# Patient Record
Sex: Male | Born: 1990
Health system: Southern US, Community
[De-identification: ages and names within clinical notes are randomized; demographics above are authoritative.]

## PROBLEM LIST (undated history)

## (undated) DIAGNOSIS — F32A Depression, unspecified: Secondary | ICD-10-CM

## (undated) DIAGNOSIS — F419 Anxiety disorder, unspecified: Secondary | ICD-10-CM

## (undated) DIAGNOSIS — F909 Attention-deficit hyperactivity disorder, unspecified type: Secondary | ICD-10-CM

## (undated) HISTORY — PX: SKIN BIOPSY: SHX1

## (undated) HISTORY — DX: Attention-deficit hyperactivity disorder, unspecified type: F90.9

---

## 2017-12-28 DIAGNOSIS — M9903 Segmental and somatic dysfunction of lumbar region: Secondary | ICD-10-CM | POA: Diagnosis not present

## 2017-12-28 DIAGNOSIS — M9904 Segmental and somatic dysfunction of sacral region: Secondary | ICD-10-CM | POA: Diagnosis not present

## 2017-12-28 DIAGNOSIS — M5386 Other specified dorsopathies, lumbar region: Secondary | ICD-10-CM | POA: Diagnosis not present

## 2018-01-02 DIAGNOSIS — M9903 Segmental and somatic dysfunction of lumbar region: Secondary | ICD-10-CM | POA: Diagnosis not present

## 2018-01-02 DIAGNOSIS — M5386 Other specified dorsopathies, lumbar region: Secondary | ICD-10-CM | POA: Diagnosis not present

## 2018-01-02 DIAGNOSIS — M9904 Segmental and somatic dysfunction of sacral region: Secondary | ICD-10-CM | POA: Diagnosis not present

## 2018-01-29 DIAGNOSIS — H16223 Keratoconjunctivitis sicca, not specified as Sjogren's, bilateral: Secondary | ICD-10-CM | POA: Diagnosis not present

## 2018-02-18 ENCOUNTER — Emergency Department (HOSPITAL_COMMUNITY)
Admission: EM | Admit: 2018-02-18 | Discharge: 2018-02-18 | Disposition: A | Discharge status: Home / Self Care | Payer: Commercial Managed Care - PPO | Attending: Emergency Medicine | Admitting: Emergency Medicine

## 2018-02-18 ENCOUNTER — Emergency Department (HOSPITAL_COMMUNITY): Payer: Commercial Managed Care - PPO

## 2018-02-18 ENCOUNTER — Emergency Department (HOSPITAL_BASED_OUTPATIENT_CLINIC_OR_DEPARTMENT_OTHER): Payer: Commercial Managed Care - PPO

## 2018-02-18 ENCOUNTER — Encounter (HOSPITAL_COMMUNITY): Payer: Self-pay | Admitting: Emergency Medicine

## 2018-02-18 DIAGNOSIS — R0789 Other chest pain: Secondary | ICD-10-CM | POA: Insufficient documentation

## 2018-02-18 DIAGNOSIS — M79609 Pain in unspecified limb: Secondary | ICD-10-CM

## 2018-02-18 DIAGNOSIS — M79605 Pain in left leg: Secondary | ICD-10-CM | POA: Insufficient documentation

## 2018-02-18 DIAGNOSIS — R079 Chest pain, unspecified: Secondary | ICD-10-CM | POA: Diagnosis not present

## 2018-02-18 LAB — BASIC METABOLIC PANEL
Anion gap: 12 (ref 5–15)
BUN: 15 mg/dL (ref 6–20)
CO2: 24 mmol/L (ref 22–32)
CREATININE: 1.15 mg/dL (ref 0.61–1.24)
Calcium: 9.4 mg/dL (ref 8.9–10.3)
Chloride: 101 mmol/L (ref 98–111)
GFR calc Af Amer: >60 mL/min (ref >60)
Glucose, Bld: 82 mg/dL (ref 70–99)
Potassium: 4.1 mmol/L (ref 3.5–5.1)
SODIUM: 137 mmol/L (ref 135–145)

## 2018-02-18 LAB — I-STAT TROPONIN, ED
Troponin i, poc: 0 ng/mL (ref 0.00–0.08)
Troponin i, poc: 0 ng/mL (ref 0.00–0.08)

## 2018-02-18 LAB — CBC
HEMATOCRIT: 43.9 % (ref 39.0–52.0)
HEMOGLOBIN: 14.9 g/dL (ref 13.0–17.0)
MCH: 29.9 pg (ref 26.0–34.0)
MCHC: 33.9 g/dL (ref 30.0–36.0)
MCV: 88.2 fL (ref 78.0–100.0)
Platelets: 257 10*3/uL (ref 150–400)
RBC: 4.98 MIL/uL (ref 4.22–5.81)
RDW: 12 % (ref 11.5–15.5)
WBC: 5.8 10*3/uL (ref 4.0–10.5)

## 2018-02-18 MED ORDER — IBUPROFEN 600 MG PO TABS: 600.0000 mg | ORAL_TABLET | Freq: Four times a day (QID) | ORAL | 0 refills | Status: DC | PRN

## 2018-02-18 MED ORDER — IBUPROFEN 200 MG PO TABS
600.0000 mg | ORAL_TABLET | Freq: Once | ORAL | Status: AC
  Administered 2018-02-18: 600 mg via ORAL
  Filled 2018-02-18: qty 1

## 2018-02-18 NOTE — Discharge Instructions (Addendum)
Take ibuprofen every 6 hours as needed for your pain.  Avoid foods and drinks that make your chest pain worse.  You can try over-the-counter Pepcid as needed for this.  Please follow-up with your primary care provider and/or Dr. Eulah PontMurphy, the orthopedic doctor, for further evaluation and treatment of your leg pain.  Use ice on your leg 3-4 times daily alternating 20 minutes on, 20 minutes off.  Return to emergency department immediately if you develop any new or worsening symptoms.

## 2018-02-18 NOTE — Progress Notes (Signed)
VASCULAR LAB PRELIMINARY  PRELIMINARY  PRELIMINARY  PRELIMINARY  Left lower extremity venous duplex completed.    Preliminary report:  There is no DVT or SVT noted in the left lower extremity.   Called results to Elizebeth BrookingEmily  Luticia Tadros, RVT 02/18/2018, 3:14 PM

## 2018-02-18 NOTE — ED Provider Notes (Signed)
[facility] EMERGENCY DEPARTMENT Provider Note   CSN: [id number] Arrival date & time: 02/18/18  1146     History   Chief Complaint Chief Complaint  Patient presents with  . Leg Pain    HPI Glenn Hoffman is a 27 y.o. male who is previously healthy who presents with a 3 to 4 week history of left leg pain.  He reports he woke up one day and felt like a tight muscle, however it continues to hurt him.  Is worse with walking.  He has not noted any significant swelling.  He also reports prior to that having a dull ache in the left side of his chest.  It is intermittent.  It is sometimes worse after big meal.  He states it is worse on exertion he reports sometimes it might be pleuritic.  He reports today he was sitting at a computer in both his legs were going.  After this, he reports he became short of breath and his fingers became numb and tingly.  He denies any nausea, vomiting, abdominal pain, back pain, saddle anesthesia, loss of bowel or bladder control.  He also denies any recent long trips, surgeries, personal history of blood clot.  HPI  History reviewed. No pertinent past medical history.  There are no active problems to display for this patient.   History reviewed. No pertinent surgical history.      Home Medications    Prior to Admission medications   Medication Sig Start Date End Date Taking? Authorizing Provider  ibuprofen (ADVIL,MOTRIN) 600 MG tablet Take 1 tablet (600 mg total) by mouth every 6 (six) hours as needed. 02/18/18   Emi Holes, PA-C    Family History No family history on file.  Social History Social History   Tobacco Use  . Smoking status: Not on file  Substance Use Topics  . Alcohol use: Not on file  . Drug use: Not on file     Allergies   Patient has no known allergies.   Review of Systems Review of Systems  Constitutional: Negative for chills and fever.  HENT: Negative for facial swelling and sore throat.     Respiratory: Positive for shortness of breath.   Cardiovascular: Positive for chest pain.  Gastrointestinal: Negative for abdominal pain, nausea and vomiting.  Genitourinary: Negative for dysuria.  Musculoskeletal: Positive for myalgias. Negative for back pain.  Skin: Negative for rash and wound.  Neurological: Positive for numbness. Negative for headaches.  Psychiatric/Behavioral: The patient is not nervous/anxious.      Physical Exam Updated Vital Signs BP 132/87 (BP Location: Right Arm)   Pulse 63   Temp 97.9 F (36.6 C) (Oral)   Resp 16   SpO2 100%   Physical Exam  Constitutional: He appears well-developed and well-nourished. No distress.  HENT:  Head: Normocephalic and atraumatic.  Mouth/Throat: Oropharynx is clear and moist. No oropharyngeal exudate.  Eyes: Pupils are equal, round, and reactive to light. Conjunctivae are normal. Right eye exhibits no discharge. Left eye exhibits no discharge. No scleral icterus.  Neck: Normal range of motion. Neck supple. No thyromegaly present.  Cardiovascular: Normal rate, regular rhythm, normal heart sounds and intact distal pulses. Exam reveals no gallop and no friction rub.  No murmur heard. Pulmonary/Chest: Effort normal and breath sounds normal. No stridor. No respiratory distress. He has no wheezes. He has no rales.  Abdominal: Soft. Bowel sounds are normal. He exhibits no distension. There is no tenderness. There is no rebound and  no guarding.  Musculoskeletal: He exhibits no edema.       Legs: No significant edema to bilateral lower extremities, but some left calf tenderness  Lymphadenopathy:    He has no cervical adenopathy.  Neurological: He is alert. Coordination normal.  Skin: Skin is warm and dry. No rash noted. He is not diaphoretic. No pallor.  Psychiatric: He has a normal mood and affect.  Nursing note and vitals reviewed.    ED Treatments / Results  Labs (all labs ordered are listed, but only abnormal results  are displayed) Labs Reviewed  BASIC METABOLIC PANEL  CBC  I-STAT TROPONIN, ED  I-STAT TROPONIN, ED    EKG None  Radiology Dg Chest 2 View  Result Date: 02/18/2018 CLINICAL DATA:  Intermittent left chest pain for the past month. EXAM: CHEST - 2 VIEW COMPARISON:  None. FINDINGS: The heart size and mediastinal contours are within normal limits. Both lungs are clear. The visualized skeletal structures are unremarkable. IMPRESSION: Normal chest. Electronically Signed   By: Obie DredgeWilliam T Derry M.D.   On: 02/18/2018 12:28    Procedures Procedures (including critical care time)  Medications Ordered in ED Medications  ibuprofen (ADVIL,MOTRIN) tablet 600 mg (600 mg Oral Given 02/18/18 1605)     Initial Impression / Assessment and Plan / ED Course  I have reviewed the triage vital signs and the nursing notes.  Pertinent labs & imaging results that were available during my care of the patient were reviewed by me and considered in my medical decision making (see chart for details).     Patient with a 3 to 4-week history of left calf pain as well as intermittent left-sided chest pain that is worse with certain foods and drinking alcohol.  CBC, BMP and also troponin are negative.  Chest x-ray is negative.  EKG shows NSR with some rightward axis.  Patient is PERC negative.  DVT ultrasound is negative.  Low suspicion for PE at this time.  Very low suspicion for ACS.  Suspect muscular cause of calf pain.  Patient may also have an anxiety component considering he has had some tingling in his hands and shortness of breath when he thinks about a possible medical problem.  Will refer to PCP for further evaluation and treatment.  I advised anti-inflammatory medication and Pepcid as GERD may be probable cause of his intermittent chest pain with large meals and alcohol.  Patient understands and agrees with plan.  Return precautions discussed.  Patient vitals stable throughout ED course and discharged in  satisfactory condition. I discussed patient case with Dr. Rhunette CroftNanavati who guided the patient's management and agrees with plan.   Final Clinical Impressions(s) / ED Diagnoses   Final diagnoses:  Left leg pain  Atypical chest pain    ED Discharge Orders        Ordered    ibuprofen (ADVIL,MOTRIN) 600 MG tablet  Every 6 hours PRN     02/18/18 1640       Emi HolesLaw, Summit Borchardt M, PA-C 02/18/18 2009    Derwood KaplanNanavati, Ankit, MD 02/19/18 [phone]

## 2018-02-18 NOTE — ED Triage Notes (Addendum)
Patient complains of left calf pain x1 month, also reports when sitting down his legs quickly feel numb. Patient reports intermittent left chest pain Patient alert, oriented, and ambulating independently with steady gait.

## 2018-02-21 DIAGNOSIS — R202 Paresthesia of skin: Secondary | ICD-10-CM | POA: Diagnosis not present

## 2018-02-21 DIAGNOSIS — R079 Chest pain, unspecified: Secondary | ICD-10-CM | POA: Diagnosis not present

## 2018-02-21 DIAGNOSIS — M79606 Pain in leg, unspecified: Secondary | ICD-10-CM | POA: Diagnosis not present

## 2018-02-21 DIAGNOSIS — M2550 Pain in unspecified joint: Secondary | ICD-10-CM | POA: Diagnosis not present

## 2018-02-25 ENCOUNTER — Ambulatory Visit
Admission: RE | Admit: 2018-02-25 | Discharge: 2018-02-25 | Disposition: A | Discharge status: Home / Self Care | Payer: Commercial Managed Care - PPO | Source: Ambulatory Visit | Attending: Family Medicine | Admitting: Family Medicine

## 2018-02-25 ENCOUNTER — Other Ambulatory Visit: Payer: Self-pay | Admitting: Family Medicine

## 2018-02-25 DIAGNOSIS — R079 Chest pain, unspecified: Secondary | ICD-10-CM

## 2018-02-25 MED ORDER — IOPAMIDOL (ISOVUE-370) INJECTION 76%
75.0000 mL | Freq: Once | INTRAVENOUS | Status: AC | PRN
  Administered 2018-02-25: 75 mL via INTRAVENOUS

## 2018-04-30 ENCOUNTER — Ambulatory Visit: Payer: Commercial Managed Care - PPO | Admitting: Neurology

## 2018-05-15 DIAGNOSIS — R350 Frequency of micturition: Secondary | ICD-10-CM | POA: Diagnosis not present

## 2018-05-21 DIAGNOSIS — I861 Scrotal varices: Secondary | ICD-10-CM | POA: Diagnosis not present

## 2018-05-21 DIAGNOSIS — N433 Hydrocele, unspecified: Secondary | ICD-10-CM | POA: Diagnosis not present

## 2020-02-19 IMAGING — CR DG CHEST 2V
2 series · 2 of 2 positions shown · non-contrast
Comparison: None.

CLINICAL DATA: Intermittent left chest pain for the past month.

EXAM:
CHEST - 2 VIEW

[chest pa]
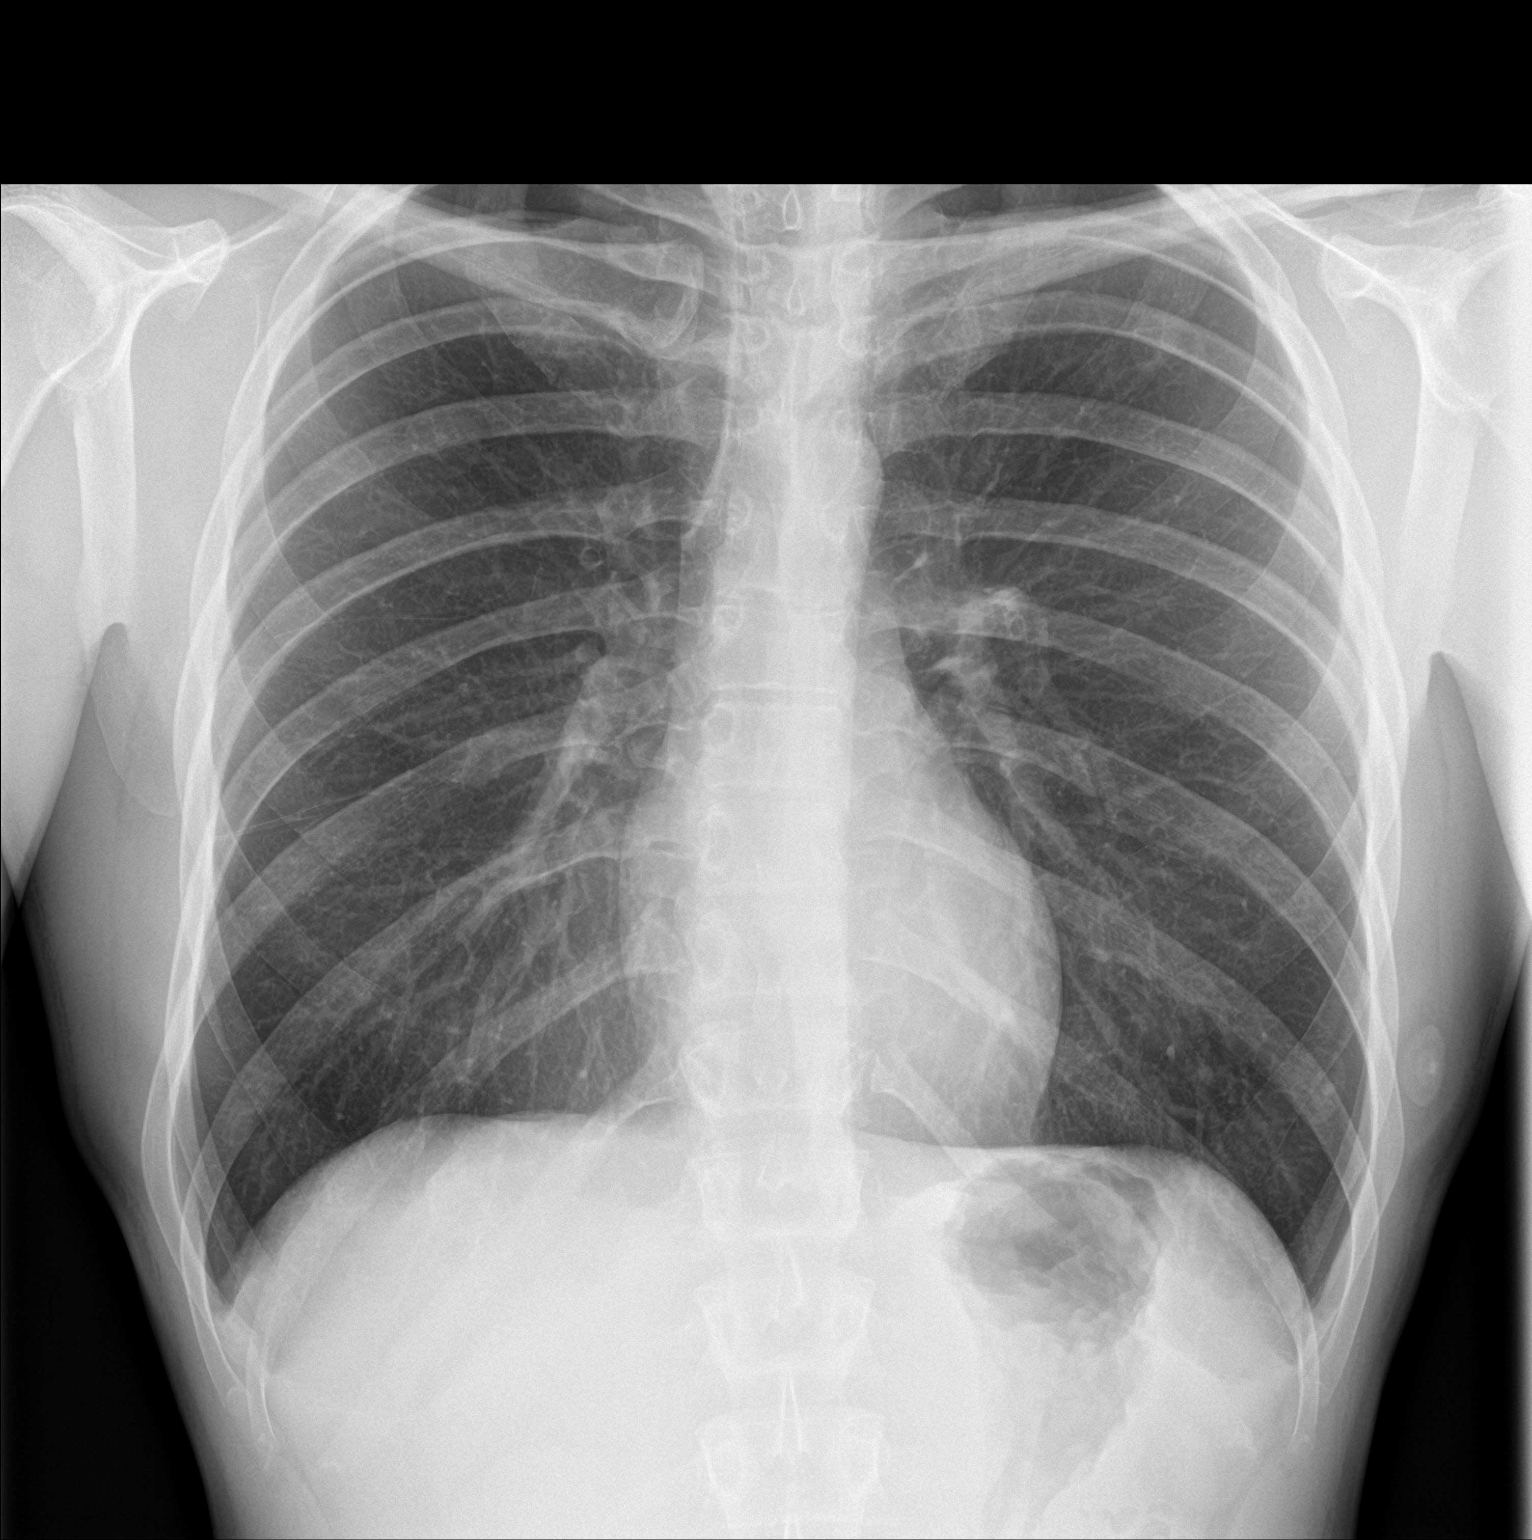

[chest lat]
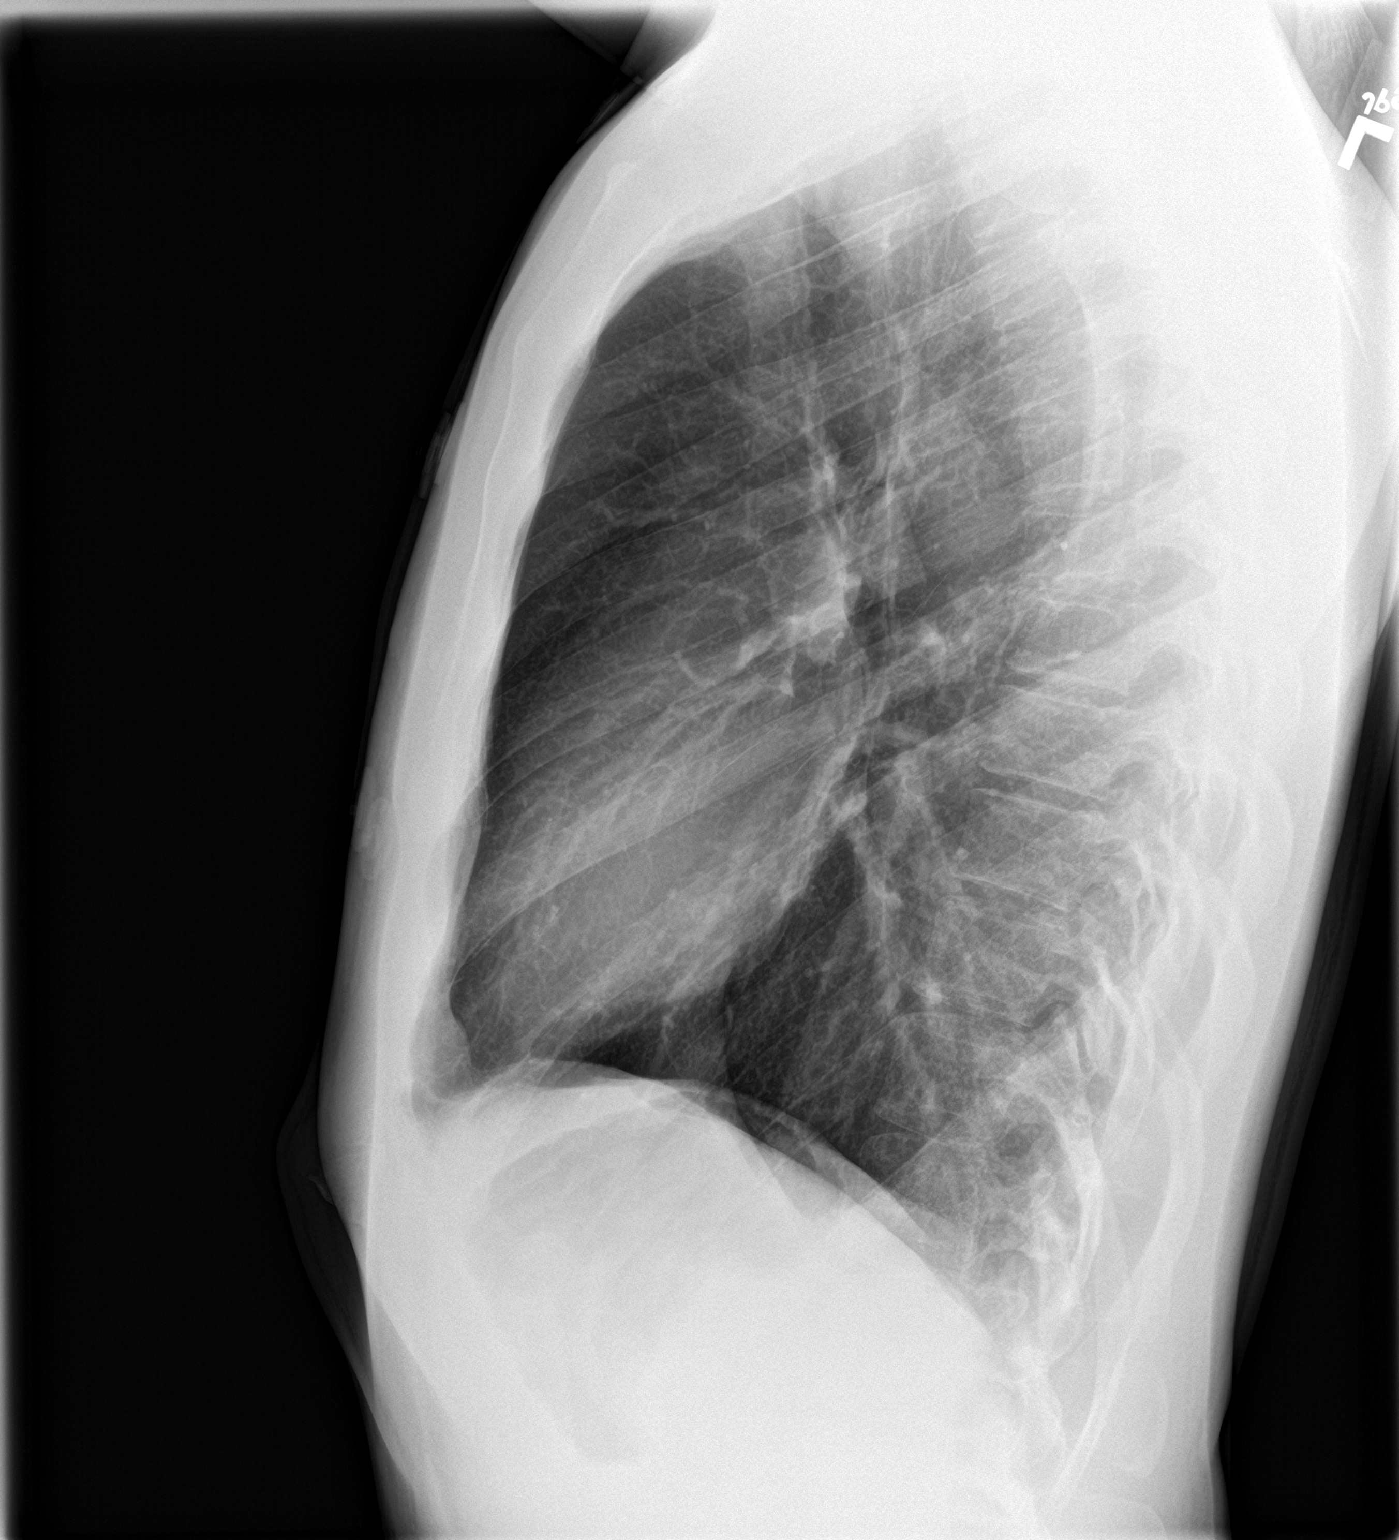

[2 of 2 positions shown; findings below may reference images not displayed]

FINDINGS: The heart size and mediastinal contours are within normal limits.
Both lungs are clear. The visualized skeletal structures are
unremarkable.
IMPRESSION: Normal chest.

## 2023-01-09 NOTE — Progress Notes (Unsigned)
NEUROLOGY CONSULTATION NOTE  Glenn Hoffman MRN: [id number] DOB: 05/09/1991  Referring provider: Lindaann Pascal, PA-C (Urgent Care referral) Primary care provider: No PCP  Reason for consult:  dizziness, headache  Assessment/Plan:   New daily persistent headache Positional dizziness  Positional blurred vision/diplopia Unclear etiology  Start with MRI/MRA of head to rule out secondary intracranial etiology He defers potential medication for now, at least until potential secondary causes are ruled out. Follow up after testing.   Subjective:  Glenn Hoffman is a 32 year old male who presents for headache and dizziness.  History supplemented by referring provider's note.  Symptoms started in March.  No preceding illness, head trauma, change in environment or new/worsening anxiety.  He has had a persistent headache.  Wakes up with dull headache (2/10) and gradually increases later in the day (no higher than 6/10).  He describes a pressure behind both eyes or across the forehead but it may lateralize to either side.  Exacerbated in severity and becomes throbbing with quick head movements or change in position, such as turning head, bending over or straightening up from a flexed position.  During this change in position, he may have associated blurred vision, sometimes actual horizontal or vertical diplopia, or dizziness (not spinning but rather an undulating sensation in his head).  Lasts just a few seconds.  When he moves his eyes, he can feel a tugging sensation behind the eye with pressure in the adjacent temple.  He also endorses aural fullness and has not sometimes pop his ears.  Occasional ringing in the ears.  Some photosenstivity which is not new.  No nausea, phonophobia, rhinorrhea, congestion, neck pain, jaw pain, numbness or weakness.  Treats with ibuprofen infrequently.  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  He went away on a trip to [place] in April and symptoms had resolved.  But they returned  once he came back home.  Head pressure is similar to symptoms he experienced when he had COVID back in early 2021.  No prior history of headaches.  About every 18 months or so, he has had episodes of dizziness and blurred vision but would last no longer than 2 weeks.       PAST MEDICAL HISTORY: Past Medical History:  Diagnosis Date   ADHD     PAST SURGICAL HISTORY: No past surgical history on file.  MEDICATIONS: Current Outpatient Medications on File Prior to Visit  Medication Sig Dispense Refill   ibuprofen (ADVIL,MOTRIN) 600 MG tablet Take 1 tablet (600 mg total) by mouth every 6 (six) hours as needed. 30 tablet 0   No current facility-administered medications on file prior to visit.    ALLERGIES: No Known Allergies  FAMILY HISTORY: No family history on file.  Objective:  Blood pressure 124/75, pulse 73, height 6\' 1"  (1.854 m), weight 173 lb 9.6 oz (78.7 kg), SpO2 97 %. General: No acute distress.  Patient appears well-groomed.   Head:  Normocephalic/atraumatic Eyes:  fundi examined but not visualized Neck: supple, no paraspinal tenderness, full range of motion Back: No paraspinal tenderness Heart: regular rate and rhythm Neurological Exam: Mental status: alert and oriented to person, place, and time, speech fluent and not dysarthric, language intact. Cranial nerves: CN I: not tested CN II: pupils equal, round and reactive to light, visual fields intact CN III, IV, VI:  full range of motion, no nystagmus, no ptosis CN V: facial sensation intact. CN VII: upper and lower face symmetric CN VIII: hearing intact CN IX, X:  gag intact, uvula midline CN XI: sternocleidomastoid and trapezius muscles intact CN XII: tongue midline Bulk & Tone: normal, no fasciculations. Motor:  muscle strength 5/5 throughout Sensation:  Pinprick, temperature and vibratory sensation intact. Deep Tendon Reflexes:  2+ throughout,  toes downgoing.   Finger to nose testing:  Without dysmetria.    Heel to shin:  Without dysmetria.   Gait:  Normal station and stride.  Romberg negative.    Thank you for allowing me to take part in the care of this patient.  Shon Millet, DO

## 2023-01-10 ENCOUNTER — Ambulatory Visit (INDEPENDENT_AMBULATORY_CARE_PROVIDER_SITE_OTHER): Payer: Managed Care, Other (non HMO) | Admitting: Neurology

## 2023-01-10 ENCOUNTER — Encounter: Payer: Self-pay | Admitting: Neurology

## 2023-01-10 VITALS — BP 124/75 | HR 73 | Ht 73.0 in | Wt 173.6 lb

## 2023-01-10 DIAGNOSIS — R42 Dizziness and giddiness: Secondary | ICD-10-CM | POA: Diagnosis not present

## 2023-01-10 DIAGNOSIS — H532 Diplopia: Secondary | ICD-10-CM | POA: Diagnosis not present

## 2023-01-10 DIAGNOSIS — G4452 New daily persistent headache (NDPH): Secondary | ICD-10-CM

## 2023-01-10 DIAGNOSIS — H814 Vertigo of central origin: Secondary | ICD-10-CM

## 2023-01-10 NOTE — Patient Instructions (Signed)
We will check MRI of brain with and without contrast and MRA of head Further recommendations pending results.

## 2023-02-26 ENCOUNTER — Other Ambulatory Visit: Payer: Managed Care, Other (non HMO)

## 2023-02-26 ENCOUNTER — Ambulatory Visit
Admission: RE | Admit: 2023-02-26 | Discharge: 2023-02-26 | Disposition: A | Discharge status: Home / Self Care | Payer: Managed Care, Other (non HMO) | Source: Ambulatory Visit | Attending: Neurology | Admitting: Neurology

## 2023-02-26 DIAGNOSIS — R42 Dizziness and giddiness: Secondary | ICD-10-CM

## 2023-02-26 DIAGNOSIS — H532 Diplopia: Secondary | ICD-10-CM

## 2023-02-26 DIAGNOSIS — G4452 New daily persistent headache (NDPH): Secondary | ICD-10-CM

## 2023-02-26 MED ORDER — SODIUM CHLORIDE 0.9 % IV BOLUS
500.0000 mL | Freq: Once | INTRAVENOUS | Status: AC
  Administered 2023-02-26: 500 mL via INTRAVENOUS

## 2023-02-26 NOTE — Progress Notes (Signed)
Pt was brought over to the nursing station after a vagal response from starting an IV in MRI. Pt was alert and oriented when she arrived, no complaints other than dizziness. The patients Vitals were obtained. 500 cc bolus was given and the patient reported he felt much better. However, the patients heart rate remained to stay low but the patient stated he wanted to leave to go home. Dr. Karin Golden was notified. Explained to the patient that if his HR remained low, he became dizzy again, or passed out, he should seek emergency medical attention. The patient verbalized understanding and insisted on leaving our facility.

## 2023-02-28 ENCOUNTER — Telehealth: Payer: Self-pay

## 2023-02-28 ENCOUNTER — Ambulatory Visit
Admission: RE | Admit: 2023-02-28 | Discharge: 2023-02-28 | Disposition: A | Discharge status: Home / Self Care | Payer: Managed Care, Other (non HMO) | Source: Ambulatory Visit | Attending: Neurology | Admitting: Neurology

## 2023-02-28 ENCOUNTER — Telehealth: Payer: Self-pay | Admitting: Neurology

## 2023-02-28 DIAGNOSIS — D329 Benign neoplasm of meninges, unspecified: Secondary | ICD-10-CM

## 2023-02-28 MED ORDER — GADOPICLENOL 0.5 MMOL/ML IV SOLN
7.5000 mL | Freq: Once | INTRAVENOUS | Status: AC | PRN
  Administered 2023-02-28: 7.5 mL via INTRAVENOUS

## 2023-02-28 NOTE — Telephone Encounter (Signed)
Telephone call from [facility] reading room Abnormal results,  IMPRESSION: 6.4 cm right parietal extra-axial mass with superior sagittal sinus invasion, most suggestive of a meningioma with solitary fibrous tumor/hemangiopericytoma being a differential consideration.

## 2023-02-28 NOTE — Telephone Encounter (Signed)
Pt is calling to see if he could get his results from the MRI that he had done on today.

## 2023-02-28 NOTE — Telephone Encounter (Signed)
Patient is calling for results from MRI

## 2023-03-01 NOTE — Telephone Encounter (Signed)
Patient called this morning wanting results. Patient would like to have them this morning as it is important/KB

## 2023-03-01 NOTE — Telephone Encounter (Signed)
I spoke with Glenn Hoffman regarding MRI results of the meningioma.  I would like to refer him to neurosurgery for further evaluation and potential treatment.   I answered all questions to the best of my ability    Referral sent to [facility]

## 2023-03-20 ENCOUNTER — Ambulatory Visit (HOSPITAL_COMMUNITY)
Admission: RE | Admit: 2023-03-20 | Discharge: 2023-03-20 | Disposition: A | Discharge status: Home / Self Care | Payer: Managed Care, Other (non HMO) | Source: Ambulatory Visit | Attending: Neurosurgery | Admitting: Neurosurgery

## 2023-03-20 ENCOUNTER — Ambulatory Visit (HOSPITAL_COMMUNITY): Payer: Managed Care, Other (non HMO)

## 2023-03-20 ENCOUNTER — Other Ambulatory Visit (HOSPITAL_COMMUNITY): Payer: Self-pay | Admitting: Neurosurgery

## 2023-03-20 DIAGNOSIS — D320 Benign neoplasm of cerebral meninges: Secondary | ICD-10-CM

## 2023-03-20 MED ORDER — GADOBUTROL 1 MMOL/ML IV SOLN
7.5000 mL | Freq: Once | INTRAVENOUS | Status: AC | PRN
  Administered 2023-03-20: 7.5 mL via INTRAVENOUS

## 2023-03-28 ENCOUNTER — Other Ambulatory Visit: Payer: Self-pay | Admitting: Neurosurgery

## 2023-03-28 ENCOUNTER — Other Ambulatory Visit (HOSPITAL_COMMUNITY): Payer: Self-pay | Admitting: Neurosurgery

## 2023-03-28 DIAGNOSIS — D320 Benign neoplasm of cerebral meninges: Secondary | ICD-10-CM

## 2023-04-03 ENCOUNTER — Other Ambulatory Visit: Payer: Self-pay | Admitting: Neurosurgery

## 2023-04-10 NOTE — Progress Notes (Signed)
Surgical Instructions   Your procedure is scheduled on Tuesday April 17, 2023. Report to [facility] Main Entrance "A" at 7:00 A.M., then check in with the Admitting office. Any questions or running late day of surgery: call [phone]  Questions prior to your surgery date: call [phone], Monday-Friday, 8am-4pm. If you experience any cold or flu symptoms such as cough, fever, chills, shortness of breath, etc. between now and your scheduled surgery, please notify us at the above number.     Remember:  Do not eat or drink after midnight the night before your surgery  Take these medicines the morning of surgery with A SIP OF WATER: NONE   One week prior to surgery, STOP taking any Aspirin (unless otherwise instructed by your surgeon) Aleve, Naproxen, Ibuprofen, Motrin, Advil, Goody's, BC's, all herbal medications, fish oil, and non-prescription vitamins.                     Do NOT Smoke (Tobacco/Vaping) for 24 hours prior to your procedure.  If you use a CPAP at night, you may bring your mask/headgear for your overnight stay.   You will be asked to remove any contacts, glasses, piercing's, hearing aid's, dentures/partials prior to surgery. Please bring cases for these items if needed.    Patients discharged the day of surgery will not be allowed to drive home, and someone needs to stay with them for 24 hours.  SURGICAL WAITING ROOM VISITATION Patients may have no more than 2 support people in the waiting area - these visitors may rotate.   Pre-op nurse will coordinate an appropriate time for 1 ADULT support person, who may not rotate, to accompany patient in pre-op.  Children under the age of 19 must have an adult with them who is not the patient and must remain in the main waiting area with an adult.  If the patient needs to stay at the hospital during part of their recovery, the visitor guidelines for inpatient rooms apply.  Please refer to the [facility] website for the  visitor guidelines for any additional information.   If you received a COVID test during your pre-op visit  it is requested that you wear a mask when out in public, stay away from anyone that may not be feeling well and notify your surgeon if you develop symptoms. If you have been in contact with anyone that has tested positive in the last 10 days please notify you surgeon.      Pre-operative CHG Bathing Instructions   You can play a key role in reducing the risk of infection after surgery. Your skin needs to be as free of germs as possible. You can reduce the number of germs on your skin by washing with CHG (chlorhexidine gluconate) soap before surgery. CHG is an antiseptic soap that kills germs and continues to kill germs even after washing.   DO NOT use if you have an allergy to chlorhexidine/CHG or antibacterial soaps. If your skin becomes reddened or irritated, stop using the CHG and notify one of our RNs at [phone].              TAKE A SHOWER THE NIGHT BEFORE SURGERY AND THE DAY OF SURGERY    Please keep in mind the following:  DO NOT shave, including legs and underarms, 48 hours prior to surgery.   You may shave your face before/day of surgery.  Place clean sheets on your bed the night before surgery Use a clean washcloth (  not used since being washed) for each shower. DO NOT sleep with pet's night before surgery.  CHG Shower Instructions:  If you choose to wash your hair and private area, wash first with your normal shampoo/soap.  After you use shampoo/soap, rinse your hair and body thoroughly to remove shampoo/soap residue.  Turn the water OFF and apply half the bottle of CHG soap to a CLEAN washcloth.  Apply CHG soap ONLY FROM YOUR NECK DOWN TO YOUR TOES (washing for 3-5 minutes)  DO NOT use CHG soap on face, private areas, open wounds, or sores.  Pay special attention to the area where your surgery is being performed.  If you are having back surgery, having someone wash  your back for you may be helpful. Wait 2 minutes after CHG soap is applied, then you may rinse off the CHG soap.  Pat dry with a clean towel  Put on clean pajamas    Additional instructions for the day of surgery: DO NOT APPLY any lotions, deodorants or cologne.   Do not wear jewelry Do not bring valuables to the hospital. [facility] is not responsible for valuables/personal belongings. Put on clean/comfortable clothes.  Please brush your teeth.  Ask your nurse before applying any prescription medications to the skin.

## 2023-04-11 ENCOUNTER — Other Ambulatory Visit: Payer: Self-pay

## 2023-04-11 ENCOUNTER — Encounter (HOSPITAL_COMMUNITY): Payer: Self-pay

## 2023-04-11 ENCOUNTER — Encounter (HOSPITAL_COMMUNITY)
Admission: RE | Admit: 2023-04-11 | Discharge: 2023-04-11 | Disposition: A | Discharge status: Home / Self Care | Payer: Managed Care, Other (non HMO) | Source: Ambulatory Visit | Attending: Neurosurgery | Admitting: Neurosurgery

## 2023-04-11 VITALS — BP 129/90 | HR 88 | Temp 98.8°F | Resp 18 | Ht 73.0 in | Wt 167.2 lb

## 2023-04-11 DIAGNOSIS — Z01818 Encounter for other preprocedural examination: Secondary | ICD-10-CM

## 2023-04-11 DIAGNOSIS — Z01812 Encounter for preprocedural laboratory examination: Secondary | ICD-10-CM | POA: Diagnosis not present

## 2023-04-11 HISTORY — DX: Depression, unspecified: F32.A

## 2023-04-11 HISTORY — DX: Anxiety disorder, unspecified: F41.9

## 2023-04-11 LAB — COMPREHENSIVE METABOLIC PANEL
ALT: 26 U/L (ref 0–44)
AST: 20 U/L (ref 15–41)
Albumin: 4.5 g/dL (ref 3.5–5.0)
Alkaline Phosphatase: 38 U/L (ref 38–126)
Anion gap: 9 (ref 5–15)
BUN: 13 mg/dL (ref 6–20)
CO2: 30 mmol/L (ref 22–32)
Calcium: 9.5 mg/dL (ref 8.9–10.3)
Chloride: 100 mmol/L (ref 98–111)
Creatinine, Ser: 0.93 mg/dL (ref 0.61–1.24)
GFR, Estimated: >60 mL/min (ref >60)
Glucose, Bld: 85 mg/dL (ref 70–99)
Potassium: 3.8 mmol/L (ref 3.5–5.1)
Sodium: 139 mmol/L (ref 135–145)
Total Bilirubin: 0.7 mg/dL (ref 0.3–1.2)
Total Protein: 7.3 g/dL (ref 6.5–8.1)

## 2023-04-11 LAB — CBC WITH DIFFERENTIAL/PLATELET
Abs Immature Granulocytes: 0.22 10*3/uL — ABNORMAL HIGH (ref 0.00–0.07)
Basophils Absolute: 0.1 10*3/uL (ref 0.0–0.1)
Basophils Relative: 1 %
Eosinophils Absolute: 0.1 10*3/uL (ref 0.0–0.5)
Eosinophils Relative: 0 %
HCT: 47.1 % (ref 39.0–52.0)
Hemoglobin: 15.7 g/dL (ref 13.0–17.0)
Immature Granulocytes: 2 %
Lymphocytes Relative: 24 %
Lymphs Abs: 3 10*3/uL (ref 0.7–4.0)
MCH: 29.8 pg (ref 26.0–34.0)
MCHC: 33.3 g/dL (ref 30.0–36.0)
MCV: 89.4 fL (ref 80.0–100.0)
Monocytes Absolute: 1 10*3/uL (ref 0.1–1.0)
Monocytes Relative: 8 %
Neutro Abs: 8.3 10*3/uL — ABNORMAL HIGH (ref 1.7–7.7)
Neutrophils Relative %: 65 %
Platelets: 299 10*3/uL (ref 150–400)
RBC: 5.27 MIL/uL (ref 4.22–5.81)
RDW: 12.9 % (ref 11.5–15.5)
WBC: 12.7 10*3/uL — ABNORMAL HIGH (ref 4.0–10.5)
nRBC: 0 % (ref 0.0–0.2)

## 2023-04-11 LAB — URINALYSIS, ROUTINE W REFLEX MICROSCOPIC
Bilirubin Urine: NEGATIVE
Glucose, UA: NEGATIVE mg/dL
Hgb urine dipstick: NEGATIVE
Ketones, ur: NEGATIVE mg/dL
Leukocytes,Ua: NEGATIVE
Nitrite: NEGATIVE
Protein, ur: NEGATIVE mg/dL
Specific Gravity, Urine: 1.01 (ref 1.005–1.030)
pH: 8 (ref 5.0–8.0)

## 2023-04-11 LAB — APTT: aPTT: 25 s (ref 24–36)

## 2023-04-11 LAB — PROTIME-INR
INR: 0.9 (ref 0.8–1.2)
Prothrombin Time: 12.3 s (ref 11.4–15.2)

## 2023-04-11 NOTE — Progress Notes (Addendum)
PCP - Dr. Wylene Simmer Cardiologist - denies  Chest x-ray - denies EKG - n/a Stress Test - denies  ECHO - denies Cardiac Cath - denies  ERAS Protcol - NPO PRE-SURGERY Ensure or G2-   No smoking 24 hours prior to surgery.  2 day surgery, both consents signed.  COVID TEST- n/a. Recent travels to [place] 03/30/23-8/31-24. Denies any Covid symptoms. Instructed to call surgeon if any symptoms develop   Patient denies shortness of breath, fever, cough and chest pain at PAT appointment  Addendum: Elevated WBC results routed to Dr. Conchita Paris.

## 2023-04-11 NOTE — Progress Notes (Signed)
Surgical Instructions   Your procedure is scheduled on Tuesday April 17, 2023. Report to [facility] Main Entrance "A" at 7:00 A.M., then check in with the Admitting office. Any questions or running late day of surgery: call [phone]  Questions prior to your surgery date: call [phone], Monday-Friday, 8am-4pm. If you experience any cold or flu symptoms such as cough, fever, chills, shortness of breath, etc. between now and your scheduled surgery, please notify us at the above number.     Remember:  Do not eat or drink after midnight the night before your surgery  Take these medicines the morning of surgery with A SIP OF WATER: ESCITALOPRAM (Lexapro)   One week prior to surgery, STOP taking any Aspirin (unless otherwise instructed by your surgeon) Aleve, Naproxen, Ibuprofen, Motrin, Advil, Goody's, BC's, all herbal medications, fish oil, and non-prescription vitamins.                     Do NOT Smoke (Tobacco/Vaping) for 24 hours prior to your procedure.  If you use a CPAP at night, you may bring your mask/headgear for your overnight stay.   You will be asked to remove any contacts, glasses, piercing's, hearing aid's, dentures/partials prior to surgery. Please bring cases for these items if needed.    Patients discharged the day of surgery will not be allowed to drive home, and someone needs to stay with them for 24 hours.  SURGICAL WAITING ROOM VISITATION Patients may have no more than 2 support people in the waiting area - these visitors may rotate.   Pre-op nurse will coordinate an appropriate time for 1 ADULT support person, who may not rotate, to accompany patient in pre-op.  Children under the age of 90 must have an adult with them who is not the patient and must remain in the main waiting area with an adult.  If the patient needs to stay at the hospital during part of their recovery, the visitor guidelines for inpatient rooms apply.  Please refer to the [facility]  website for the visitor guidelines for any additional information.   If you received a COVID test during your pre-op visit  it is requested that you wear a mask when out in public, stay away from anyone that may not be feeling well and notify your surgeon if you develop symptoms. If you have been in contact with anyone that has tested positive in the last 10 days please notify you surgeon.      Pre-operative CHG Bathing Instructions   You can play a key role in reducing the risk of infection after surgery. Your skin needs to be as free of germs as possible. You can reduce the number of germs on your skin by washing with CHG (chlorhexidine gluconate) soap before surgery. CHG is an antiseptic soap that kills germs and continues to kill germs even after washing.   DO NOT use if you have an allergy to chlorhexidine/CHG or antibacterial soaps. If your skin becomes reddened or irritated, stop using the CHG and notify one of our RNs at [phone].              TAKE A SHOWER THE NIGHT BEFORE SURGERY AND THE DAY OF SURGERY    Please keep in mind the following:  DO NOT shave, including legs and underarms, 48 hours prior to surgery.   You may shave your face before/day of surgery.  Place clean sheets on your bed the night before surgery Use a clean  washcloth (not used since being washed) for each shower. DO NOT sleep with pet's night before surgery.  CHG Shower Instructions:  If you choose to wash your hair and private area, wash first with your normal shampoo/soap.  After you use shampoo/soap, rinse your hair and body thoroughly to remove shampoo/soap residue.  Turn the water OFF and apply half the bottle of CHG soap to a CLEAN washcloth.  Apply CHG soap ONLY FROM YOUR NECK DOWN TO YOUR TOES (washing for 3-5 minutes)  DO NOT use CHG soap on face, private areas, open wounds, or sores.  Pay special attention to the area where your surgery is being performed.  If you are having back surgery,  having someone wash your back for you may be helpful. Wait 2 minutes after CHG soap is applied, then you may rinse off the CHG soap.  Pat dry with a clean towel  Put on clean pajamas    Additional instructions for the day of surgery: DO NOT APPLY any lotions, deodorants or cologne.   Do not wear jewelry Do not bring valuables to the hospital. [facility] is not responsible for valuables/personal belongings. Put on clean/comfortable clothes.  Please brush your teeth.  Ask your nurse before applying any prescription medications to the skin.

## 2023-04-17 ENCOUNTER — Encounter (HOSPITAL_COMMUNITY)
Admission: RE | Disposition: A | Discharge status: Home / Self Care | Payer: Self-pay | Source: Home / Self Care | Attending: Neurosurgery

## 2023-04-17 ENCOUNTER — Inpatient Hospital Stay (HOSPITAL_COMMUNITY): Payer: Managed Care, Other (non HMO)

## 2023-04-17 ENCOUNTER — Encounter (HOSPITAL_COMMUNITY): Payer: Self-pay | Admitting: Neurosurgery

## 2023-04-17 ENCOUNTER — Inpatient Hospital Stay (HOSPITAL_COMMUNITY)
Admission: RE | Admit: 2023-04-17 | Discharge: 2023-04-17 | Disposition: A | Discharge status: Home / Self Care | Payer: Managed Care, Other (non HMO) | Source: Ambulatory Visit | Attending: Neurosurgery | Admitting: Neurosurgery

## 2023-04-17 ENCOUNTER — Other Ambulatory Visit: Payer: Self-pay

## 2023-04-17 ENCOUNTER — Inpatient Hospital Stay (HOSPITAL_COMMUNITY)
Admission: RE | Admit: 2023-04-17 | Discharge: 2023-04-20 | DRG: 027 | Disposition: A | Discharge status: Home / Self Care | Payer: Managed Care, Other (non HMO) | Attending: Neurosurgery | Admitting: Neurosurgery

## 2023-04-17 DIAGNOSIS — D320 Benign neoplasm of cerebral meninges: Principal | ICD-10-CM | POA: Diagnosis present

## 2023-04-17 DIAGNOSIS — D329 Benign neoplasm of meninges, unspecified: Secondary | ICD-10-CM | POA: Diagnosis present

## 2023-04-17 DIAGNOSIS — Z832 Family history of diseases of the blood and blood-forming organs and certain disorders involving the immune mechanism: Secondary | ICD-10-CM | POA: Diagnosis not present

## 2023-04-17 DIAGNOSIS — R519 Headache, unspecified: Secondary | ICD-10-CM | POA: Diagnosis not present

## 2023-04-17 DIAGNOSIS — Z79899 Other long term (current) drug therapy: Secondary | ICD-10-CM | POA: Diagnosis not present

## 2023-04-17 DIAGNOSIS — Z823 Family history of stroke: Secondary | ICD-10-CM

## 2023-04-17 DIAGNOSIS — F909 Attention-deficit hyperactivity disorder, unspecified type: Secondary | ICD-10-CM | POA: Diagnosis present

## 2023-04-17 DIAGNOSIS — F418 Other specified anxiety disorders: Secondary | ICD-10-CM

## 2023-04-17 DIAGNOSIS — F32A Depression, unspecified: Secondary | ICD-10-CM | POA: Diagnosis present

## 2023-04-17 DIAGNOSIS — F419 Anxiety disorder, unspecified: Secondary | ICD-10-CM | POA: Diagnosis present

## 2023-04-17 HISTORY — PX: IR ANGIO EXTERNAL CAROTID SEL EXT CAROTID UNI R MOD SED: IMG5371

## 2023-04-17 HISTORY — PX: IR TRANSCATH/EMBOLIZ: IMG695

## 2023-04-17 HISTORY — PX: IR ANGIOGRAM FOLLOW UP STUDY: IMG697

## 2023-04-17 HISTORY — PX: RADIOLOGY WITH ANESTHESIA: SHX6223

## 2023-04-17 HISTORY — PX: IR NEURO EACH ADD'L AFTER BASIC UNI RIGHT (MS): IMG5374

## 2023-04-17 HISTORY — PX: IR ANGIO INTRA EXTRACRAN SEL INTERNAL CAROTID UNI R MOD SED: IMG5362

## 2023-04-17 LAB — MRSA NEXT GEN BY PCR, NASAL: MRSA by PCR Next Gen: NOT DETECTED

## 2023-04-17 LAB — ABO/RH: ABO/RH(D): A POS

## 2023-04-17 SURGERY — IR WITH ANESTHESIA
Anesthesia: General

## 2023-04-17 MED ORDER — FENTANYL CITRATE (PF) 100 MCG/2ML IJ SOLN: 25.0000 ug | INTRAMUSCULAR | Status: DC | PRN

## 2023-04-17 MED ORDER — ONDANSETRON HCL 4 MG PO TABS: 4.0000 mg | ORAL_TABLET | ORAL | Status: DC | PRN

## 2023-04-17 MED ORDER — DEXAMETHASONE SODIUM PHOSPHATE 10 MG/ML IJ SOLN
6.0000 mg | Freq: Four times a day (QID) | INTRAMUSCULAR | Status: AC
  Administered 2023-04-17 (×2): 6 mg via INTRAVENOUS
  Administered 2023-04-18: 4 mg via INTRAVENOUS
  Administered 2023-04-18: 6 mg via INTRAVENOUS
  Filled 2023-04-17 (×3): qty 1

## 2023-04-17 MED ORDER — SODIUM CHLORIDE 0.9 % IV SOLN: INTRAVENOUS | Status: DC

## 2023-04-17 MED ORDER — ORAL CARE MOUTH RINSE: 15.0000 mL | Freq: Once | OROMUCOSAL | Status: AC

## 2023-04-17 MED ORDER — HYDROMORPHONE HCL 1 MG/ML IJ SOLN
INTRAMUSCULAR | Status: AC
  Filled 2023-04-17: qty 0.5

## 2023-04-17 MED ORDER — DEXAMETHASONE SODIUM PHOSPHATE 4 MG/ML IJ SOLN: 4.0000 mg | Freq: Four times a day (QID) | INTRAMUSCULAR | Status: DC

## 2023-04-17 MED ORDER — AMISULPRIDE (ANTIEMETIC) 5 MG/2ML IV SOLN: 10.0000 mg | Freq: Once | INTRAVENOUS | Status: DC | PRN

## 2023-04-17 MED ORDER — SUGAMMADEX SODIUM 200 MG/2ML IV SOLN
INTRAVENOUS | Status: DC | PRN
  Administered 2023-04-17: 200 mg via INTRAVENOUS

## 2023-04-17 MED ORDER — LABETALOL HCL 5 MG/ML IV SOLN: 10.0000 mg | INTRAVENOUS | Status: DC | PRN

## 2023-04-17 MED ORDER — OXYCODONE HCL 5 MG/5ML PO SOLN: 5.0000 mg | Freq: Once | ORAL | Status: DC | PRN

## 2023-04-17 MED ORDER — CHLORHEXIDINE GLUCONATE CLOTH 2 % EX PADS: 6.0000 | MEDICATED_PAD | Freq: Once | CUTANEOUS | Status: DC

## 2023-04-17 MED ORDER — ONDANSETRON HCL 4 MG/2ML IJ SOLN
INTRAMUSCULAR | Status: DC | PRN
  Administered 2023-04-17: 4 mg via INTRAVENOUS

## 2023-04-17 MED ORDER — SODIUM CHLORIDE 0.9 % IV SOLN: Freq: Once | INTRAVENOUS | Status: AC

## 2023-04-17 MED ORDER — OXYCODONE HCL 5 MG PO TABS: 5.0000 mg | ORAL_TABLET | Freq: Once | ORAL | Status: DC | PRN

## 2023-04-17 MED ORDER — CHLORHEXIDINE GLUCONATE CLOTH 2 % EX PADS
6.0000 | MEDICATED_PAD | Freq: Every day | CUTANEOUS | Status: DC
  Administered 2023-04-17 – 2023-04-19 (×3): 6 via TOPICAL

## 2023-04-17 MED ORDER — PANTOPRAZOLE SODIUM 40 MG IV SOLR
40.0000 mg | Freq: Every day | INTRAVENOUS | Status: DC
  Administered 2023-04-17: 40 mg via INTRAVENOUS
  Filled 2023-04-17: qty 10

## 2023-04-17 MED ORDER — GLYCOPYRROLATE 0.2 MG/ML IJ SOLN
INTRAMUSCULAR | Status: DC | PRN
Start: 2023-04-17 — End: 2023-04-17
  Administered 2023-04-17: .1 mg via INTRAVENOUS

## 2023-04-17 MED ORDER — ORAL CARE MOUTH RINSE: 15.0000 mL | OROMUCOSAL | Status: DC | PRN

## 2023-04-17 MED ORDER — CEFAZOLIN SODIUM-DEXTROSE 2-4 GM/100ML-% IV SOLN
2.0000 g | INTRAVENOUS | Status: AC
  Administered 2023-04-17: 2 g via INTRAVENOUS
  Filled 2023-04-17: qty 100

## 2023-04-17 MED ORDER — MORPHINE SULFATE (PF) 2 MG/ML IV SOLN
1.0000 mg | INTRAVENOUS | Status: DC | PRN
  Administered 2023-04-17: 1 mg via INTRAVENOUS
  Administered 2023-04-18 – 2023-04-19 (×5): 2 mg via INTRAVENOUS
  Administered 2023-04-20: 1 mg via INTRAVENOUS
  Filled 2023-04-17 (×7): qty 1

## 2023-04-17 MED ORDER — DEXAMETHASONE SODIUM PHOSPHATE 10 MG/ML IJ SOLN
INTRAMUSCULAR | Status: DC | PRN
  Administered 2023-04-17 (×2): 5 mg via INTRAVENOUS

## 2023-04-17 MED ORDER — PROPOFOL 10 MG/ML IV BOLUS
INTRAVENOUS | Status: DC | PRN
  Administered 2023-04-17 (×2): 100 mg via INTRAVENOUS

## 2023-04-17 MED ORDER — SODIUM CHLORIDE 0.9 % IV SOLN
INTRAVENOUS | Status: DC | PRN
Start: 2023-04-17 — End: 2023-04-17

## 2023-04-17 MED ORDER — ROCURONIUM BROMIDE 10 MG/ML (PF) SYRINGE
PREFILLED_SYRINGE | INTRAVENOUS | Status: DC | PRN
  Administered 2023-04-17: 60 mg via INTRAVENOUS

## 2023-04-17 MED ORDER — ESCITALOPRAM OXALATE 10 MG PO TABS
10.0000 mg | ORAL_TABLET | Freq: Every day | ORAL | Status: DC
  Administered 2023-04-19 – 2023-04-20 (×2): 10 mg via ORAL
  Filled 2023-04-17 (×2): qty 1

## 2023-04-17 MED ORDER — HYDROMORPHONE HCL 1 MG/ML IJ SOLN
INTRAMUSCULAR | Status: DC | PRN
Start: 2023-04-17 — End: 2023-04-17
  Administered 2023-04-17: .5 mg via INTRAVENOUS

## 2023-04-17 MED ORDER — ACETAMINOPHEN 10 MG/ML IV SOLN: 1000.0000 mg | Freq: Once | INTRAVENOUS | Status: DC | PRN

## 2023-04-17 MED ORDER — CHLORHEXIDINE GLUCONATE 0.12 % MT SOLN
15.0000 mL | Freq: Once | OROMUCOSAL | Status: AC
  Administered 2023-04-17: 15 mL via OROMUCOSAL
  Filled 2023-04-17: qty 15

## 2023-04-17 MED ORDER — PROMETHAZINE HCL 25 MG/ML IJ SOLN: 6.2500 mg | INTRAMUSCULAR | Status: DC | PRN

## 2023-04-17 MED ORDER — DEXAMETHASONE SODIUM PHOSPHATE 4 MG/ML IJ SOLN: 4.0000 mg | Freq: Three times a day (TID) | INTRAMUSCULAR | Status: DC

## 2023-04-17 MED ORDER — IOHEXOL 300 MG/ML  SOLN
150.0000 mL | Freq: Once | INTRAMUSCULAR | Status: AC | PRN
  Administered 2023-04-17: 42 mL via INTRA_ARTERIAL

## 2023-04-17 MED ORDER — HEPARIN SODIUM (PORCINE) 1000 UNIT/ML IJ SOLN
INTRAMUSCULAR | Status: DC | PRN
Start: 2023-04-17 — End: 2023-04-17
  Administered 2023-04-17: 2000 [IU] via INTRAVENOUS

## 2023-04-17 MED ORDER — ONDANSETRON HCL 4 MG/2ML IJ SOLN
4.0000 mg | INTRAMUSCULAR | Status: DC | PRN
  Administered 2023-04-18: 4 mg via INTRAVENOUS
  Filled 2023-04-17: qty 2

## 2023-04-17 MED ORDER — LIDOCAINE 2% (20 MG/ML) 5 ML SYRINGE
INTRAMUSCULAR | Status: DC | PRN
  Administered 2023-04-17: 60 mg via INTRAVENOUS

## 2023-04-17 MED ORDER — LACTATED RINGERS IV SOLN: INTRAVENOUS | Status: DC

## 2023-04-17 MED ORDER — HYDROCODONE-ACETAMINOPHEN 5-325 MG PO TABS
1.0000 | ORAL_TABLET | ORAL | Status: DC | PRN
  Filled 2023-04-17: qty 1

## 2023-04-17 NOTE — Op Note (Signed)
  NEUROSURGERY BRIEF OPERATIVE  NOTE   PREOP DX: Right meningioma  POSTOP DX: Same  PROCEDURE: Right middle meningeal artery embolization  SURGEON: Dr. Lisbeth Renshaw, MD  ANESTHESIA: GETA  APPROACH: Right trans-femoral  EBL: Minimal  SPECIMENS: None  COMPLICATIONS: None  CONDITION: Stable to recovery  FINDINGS (Full report in [facility]): 1. Successful right MMA embolization for meningioma as preoperative adjunct    Lisbeth Renshaw, MD [facility]

## 2023-04-17 NOTE — Anesthesia Procedure Notes (Signed)
Procedure Name: Intubation Date/Time: 04/17/2023 11:22 AM  Performed by: Earlene Plater, CRNAPre-anesthesia Checklist: Patient identified, Emergency Drugs available, Suction available, Patient being monitored and Timeout performed Patient Re-evaluated:Patient Re-evaluated prior to induction Oxygen Delivery Method: Circle system utilized Preoxygenation: Pre-oxygenation with 100% oxygen Induction Type: IV induction Ventilation: Mask ventilation without difficulty and Oral airway inserted - appropriate to patient size Laryngoscope Size: Mac and 4 Grade View: Grade I Tube type: Oral Tube size: 7.0 mm Number of attempts: 1 Placement Confirmation: ETT inserted through vocal cords under direct vision, positive ETCO2 and breath sounds checked- equal and bilateral Secured at: 23 (@ lips) cm Tube secured with: Tape Dental Injury: Teeth and Oropharynx as per pre-operative assessment

## 2023-04-17 NOTE — Anesthesia Procedure Notes (Signed)
Arterial Line Insertion Start/End9/05/2023 9:06 AM, 04/17/2023 9:06 AM Performed by: Dorie Rank, CRNA  Preanesthetic checklist: patient identified, IV checked, site marked, risks and benefits discussed, surgical consent, monitors and equipment checked, pre-op evaluation and timeout performed Lidocaine 1% used for infiltration Left, radial was placed Catheter size: 20 G Hand hygiene performed , maximum sterile barriers used  and Seldinger technique used Allen's test indicative of satisfactory collateral circulation Attempts: 2 Procedure performed without using ultrasound guided technique. Following insertion, Biopatch and dressing applied. Patient tolerated the procedure well with no immediate complications.

## 2023-04-17 NOTE — Anesthesia Preprocedure Evaluation (Signed)
Anesthesia Evaluation  Patient identified by MRN, date of birth, ID band Patient awake    Reviewed: Allergy & Precautions, NPO status , Patient's Chart, lab work & pertinent test results  Airway Mallampati: II  TM Distance: >3 FB Neck ROM: Full    Dental no notable dental hx. (+) Dental Advisory Given, Teeth Intact   Pulmonary neg pulmonary ROS, Patient abstained from smoking.   Pulmonary exam normal breath sounds clear to auscultation       Cardiovascular negative cardio ROS Normal cardiovascular exam Rhythm:Regular Rate:Normal     Neuro/Psych  PSYCHIATRIC DISORDERS Anxiety Depression       GI/Hepatic negative GI ROS,,,(+)     substance abuse  alcohol use and marijuana use  Endo/Other  negative endocrine ROS    Renal/GU negative Renal ROS     Musculoskeletal negative musculoskeletal ROS (+)    Abdominal   Peds  (+) ADHD Hematology negative hematology ROS (+)   Anesthesia Other Findings Cerebral meningioma  Reproductive/Obstetrics                             Anesthesia Physical Anesthesia Plan  ASA: 3  Anesthesia Plan: General   Post-op Pain Management: Tylenol PO (pre-op)*   Induction: Intravenous  PONV Risk Score and Plan: 2 and Ondansetron, Dexamethasone and Treatment may vary due to age or medical condition  Airway Management Planned: Oral ETT  Additional Equipment: Arterial line  Intra-op Plan:   Post-operative Plan: Possible Post-op intubation/ventilation  Informed Consent: I have reviewed the patients History and Physical, chart, labs and discussed the procedure including the risks, benefits and alternatives for the proposed anesthesia with the patient or authorized representative who has indicated his/her understanding and acceptance.     Dental advisory given  Plan Discussed with: CRNA  Anesthesia Plan Comments: (Remi gtt  2 x PIV)       Anesthesia  Quick Evaluation

## 2023-04-17 NOTE — Transfer of Care (Signed)
Immediate Anesthesia Transfer of Care Note  Patient: Glenn Hoffman  Procedure(s) Performed: Right middle meningeal artery embolization  Patient Location: PACU  Anesthesia Type:General  Level of Consciousness: awake, alert , oriented, patient cooperative, and responds to stimulation  Airway & Oxygen Therapy: Patient Spontanous Breathing and Patient connected to face mask oxygen  Post-op Assessment: Report given to RN, Post -op Vital signs reviewed and stable, and Patient moving all extremities  Post vital signs: Reviewed and stable  Last Vitals:  Vitals Value Taken Time  BP 122/75 04/17/23 1300  Temp 36.5 C 04/17/23 1300  Pulse 56 04/17/23 1310  Resp 13 04/17/23 1310  SpO2 99 % 04/17/23 1310  Vitals shown include unfiled device data.  Last Pain:  Vitals:   04/17/23 1300  PainSc: 2          Complications: No notable events documented.  *Report given to PACU RN for continued care and follow-up.

## 2023-04-17 NOTE — Sedation Documentation (Signed)
Right groin/fem closed with 5 Fr exoseal at 1236

## 2023-04-17 NOTE — H&P (Signed)
  Chief Complaint   No chief complaint on file.  History of Present Illness  Levern Aceto is a 32 y.o. male Mr. Elkind is a 32 year old man I am seeing at the request of my partner, Dr. Wynetta Emery. 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 I was asked to see him for consideration of preoperative meningeal artery embolization prior to planned resection.  Past Medical History  Past Medical History:  Diagnosis Date  . ADHD   . Anxiety   . Depression     Past Surgical History  Past Surgical History:  Procedure Laterality Date  . SKIN BIOPSY Right    Right knee- in derm office    Social History  Social History   Tobacco Use  . Smoking status: Never  . Smokeless tobacco: Never  Vaping Use  . Vaping status: Some Days  . Substances: THC  Substance Use Topics  . Alcohol use: Yes    Alcohol/week: 7.0 standard drinks of alcohol    Types: 7 Cans of beer per week    Comment: 1-2 beer or wine per day  . Drug use: Yes    Types: Marijuana    Medications   Prior to Admission medications   Medication Sig Start Date End Date Taking? Authorizing Provider  escitalopram (LEXAPRO) 10 MG tablet Take 10 mg by mouth daily.   Yes [provider]  ibuprofen (ADVIL) 200 MG tablet Take 200 mg by mouth as needed for moderate pain or headache.   Yes [provider]  naproxen sodium (ALEVE) 220 MG tablet Take 220 mg by mouth as needed (headache/pain).   Yes [provider]  OVER THE COUNTER MEDICATION Take 15 mg by mouth 2 (two) times daily as needed (anxiety). THC gummy 15 mg   Yes [provider]    Allergies  No Known Allergies  Review of Systems  ROS  Neurologic Exam  Awake, alert, oriented Memory and concentration grossly intact Speech fluent, appropriate CN grossly intact Motor exam: Upper Extremities Deltoid Bicep Tricep Grip  Right 5/5 5/5 5/5 5/5  Left 5/5  5/5 5/5 5/5   Lower Extremities IP Quad PF DF EHL  Right 5/5 5/5 5/5 5/5 5/5  Left 5/5 5/5 5/5 5/5 5/5   Sensation grossly intact to LT  Imaging  MRI with and without contrast as well as MRV both personally reviewed and demonstrates a large right parasagittal parietal meningioma with partial invasion the posterior third of the superior sagittal sinus. Overall the sinus does appear to be partially patent.  Impression  - 32 y.o. male with large parasagittal meningitis requiring resection, planned later this week  Plan  - We will proceed with endovascular middle meningeal artery embolization  I have reviewed the details of the endovascular procedure as well as the expected postoperative course with the patient. We have discussed the potential risks of the procedure. All his questions were answered. He provided informed consent to proceed.

## 2023-04-17 NOTE — Anesthesia Preprocedure Evaluation (Addendum)
Anesthesia Evaluation  Patient identified by MRN, date of birth, ID band Patient awake    Reviewed: Allergy & Precautions, NPO status , Patient's Chart, lab work & pertinent test results  Airway Mallampati: I  TM Distance: >3 FB Neck ROM: Full    Dental no notable dental hx.    Pulmonary neg pulmonary ROS   Pulmonary exam normal        Cardiovascular negative cardio ROS Normal cardiovascular exam     Neuro/Psych  PSYCHIATRIC DISORDERS Anxiety Depression       GI/Hepatic negative GI ROS,,,(+)     substance abuse  alcohol use and marijuana use  Endo/Other  negative endocrine ROS    Renal/GU negative Renal ROS     Musculoskeletal negative musculoskeletal ROS (+)    Abdominal   Peds  (+) ADHD Hematology negative hematology ROS (+)   Anesthesia Other Findings Cerebral meningioma  Reproductive/Obstetrics                             Anesthesia Physical Anesthesia Plan  ASA: 3  Anesthesia Plan: General   Post-op Pain Management:    Induction: Intravenous  PONV Risk Score and Plan: 2 and Ondansetron, Dexamethasone and Treatment may vary due to age or medical condition  Airway Management Planned: Oral ETT  Additional Equipment: Arterial line  Intra-op Plan:   Post-operative Plan: Extubation in OR  Informed Consent: I have reviewed the patients History and Physical, chart, labs and discussed the procedure including the risks, benefits and alternatives for the proposed anesthesia with the patient or authorized representative who has indicated his/her understanding and acceptance.     Dental advisory given  Plan Discussed with: CRNA  Anesthesia Plan Comments:        Anesthesia Quick Evaluation

## 2023-04-18 ENCOUNTER — Inpatient Hospital Stay: Payer: Self-pay

## 2023-04-18 ENCOUNTER — Inpatient Hospital Stay: Admit: 2023-04-18 | Payer: Managed Care, Other (non HMO) | Admitting: Neurosurgery

## 2023-04-18 ENCOUNTER — Inpatient Hospital Stay (HOSPITAL_COMMUNITY): Payer: Managed Care, Other (non HMO) | Admitting: Certified Registered"

## 2023-04-18 ENCOUNTER — Encounter (HOSPITAL_COMMUNITY)
Admission: RE | Disposition: A | Discharge status: Home / Self Care | Payer: Self-pay | Source: Home / Self Care | Attending: Neurosurgery

## 2023-04-18 ENCOUNTER — Encounter (HOSPITAL_COMMUNITY): Payer: Self-pay | Admitting: Neurosurgery

## 2023-04-18 ENCOUNTER — Inpatient Hospital Stay (HOSPITAL_COMMUNITY): Payer: Managed Care, Other (non HMO)

## 2023-04-18 ENCOUNTER — Other Ambulatory Visit: Payer: Self-pay

## 2023-04-18 ENCOUNTER — Inpatient Hospital Stay (HOSPITAL_COMMUNITY)
Admission: RE | Admit: 2023-04-18 | Payer: Managed Care, Other (non HMO) | Source: Home / Self Care | Admitting: Neurosurgery

## 2023-04-18 DIAGNOSIS — F418 Other specified anxiety disorders: Secondary | ICD-10-CM

## 2023-04-18 DIAGNOSIS — D320 Benign neoplasm of cerebral meninges: Secondary | ICD-10-CM

## 2023-04-18 HISTORY — PX: CRANIOTOMY: SHX93

## 2023-04-18 HISTORY — PX: APPLICATION OF CRANIAL NAVIGATION: SHX6578

## 2023-04-18 LAB — CBC
HCT: 32.3 % — ABNORMAL LOW (ref 39.0–52.0)
Hemoglobin: 11.2 g/dL — ABNORMAL LOW (ref 13.0–17.0)
MCH: 30.4 pg (ref 26.0–34.0)
MCHC: 34.7 g/dL (ref 30.0–36.0)
MCV: 87.5 fL (ref 80.0–100.0)
Platelets: 210 10*3/uL (ref 150–400)
RBC: 3.69 MIL/uL — ABNORMAL LOW (ref 4.22–5.81)
RDW: 12.7 % (ref 11.5–15.5)
WBC: 28 10*3/uL — ABNORMAL HIGH (ref 4.0–10.5)
nRBC: 0 % (ref 0.0–0.2)

## 2023-04-18 LAB — POCT I-STAT 7, (LYTES, BLD GAS, ICA,H+H)
Acid-Base Excess: 0 mmol/L (ref 0.0–2.0)
Acid-Base Excess: 0 mmol/L (ref 0.0–2.0)
Acid-base deficit: 1 mmol/L (ref 0.0–2.0)
Bicarbonate: 22 mmol/L (ref 20.0–28.0)
Bicarbonate: 23.4 mmol/L (ref 20.0–28.0)
Bicarbonate: 22.3 mmol/L (ref 20.0–28.0)
Calcium, Ion: 1.12 mmol/L — ABNORMAL LOW (ref 1.15–1.40)
Calcium, Ion: 1.15 mmol/L (ref 1.15–1.40)
Calcium, Ion: 1.09 mmol/L — ABNORMAL LOW (ref 1.15–1.40)
HCT: 37 % — ABNORMAL LOW (ref 39.0–52.0)
HCT: 38 % — ABNORMAL LOW (ref 39.0–52.0)
HCT: 35 % — ABNORMAL LOW (ref 39.0–52.0)
Hemoglobin: 12.6 g/dL — ABNORMAL LOW (ref 13.0–17.0)
Hemoglobin: 12.9 g/dL — ABNORMAL LOW (ref 13.0–17.0)
Hemoglobin: 11.9 g/dL — ABNORMAL LOW (ref 13.0–17.0)
O2 Saturation: 100 %
O2 Saturation: 100 %
O2 Saturation: 100 %
Patient temperature: 36.6
Potassium: 3.7 mmol/L (ref 3.5–5.1)
Potassium: 3.9 mmol/L (ref 3.5–5.1)
Potassium: 3.8 mmol/L (ref 3.5–5.1)
Sodium: 139 mmol/L (ref 135–145)
Sodium: 139 mmol/L (ref 135–145)
Sodium: 140 mmol/L (ref 135–145)
TCO2: 23 mmol/L (ref 22–32)
TCO2: 24 mmol/L (ref 22–32)
TCO2: 23 mmol/L (ref 22–32)
pCO2 arterial: 32.6 mmHg (ref 32–48)
pCO2 arterial: 34.5 mmHg (ref 32–48)
pCO2 arterial: 29.4 mmHg — ABNORMAL LOW (ref 32–48)
pH, Arterial: 7.438 (ref 7.35–7.45)
pH, Arterial: 7.44 (ref 7.35–7.45)
pH, Arterial: 7.487 — ABNORMAL HIGH (ref 7.35–7.45)
pO2, Arterial: 301 mmHg — ABNORMAL HIGH (ref 83–108)
pO2, Arterial: 287 mmHg — ABNORMAL HIGH (ref 83–108)
pO2, Arterial: 281 mmHg — ABNORMAL HIGH (ref 83–108)

## 2023-04-18 LAB — BASIC METABOLIC PANEL
Anion gap: 8 (ref 5–15)
BUN: 13 mg/dL (ref 6–20)
CO2: 22 mmol/L (ref 22–32)
Calcium: 7.5 mg/dL — ABNORMAL LOW (ref 8.9–10.3)
Chloride: 109 mmol/L (ref 98–111)
Creatinine, Ser: 1.05 mg/dL (ref 0.61–1.24)
GFR, Estimated: >60 mL/min (ref >60)
Glucose, Bld: 133 mg/dL — ABNORMAL HIGH (ref 70–99)
Potassium: 4.1 mmol/L (ref 3.5–5.1)
Sodium: 139 mmol/L (ref 135–145)

## 2023-04-18 LAB — PREPARE RBC (CROSSMATCH)

## 2023-04-18 SURGERY — CRANIOTOMY TUMOR EXCISION
Anesthesia: General | Site: Head | Laterality: Right

## 2023-04-18 MED ORDER — PHENYLEPHRINE HCL-NACL 20-0.9 MG/250ML-% IV SOLN
INTRAVENOUS | Status: AC
  Filled 2023-04-18: qty 500

## 2023-04-18 MED ORDER — CEFAZOLIN SODIUM-DEXTROSE 2-4 GM/100ML-% IV SOLN
2.0000 g | Freq: Three times a day (TID) | INTRAVENOUS | Status: AC
  Administered 2023-04-18 – 2023-04-19 (×2): 2 g via INTRAVENOUS
  Filled 2023-04-18 (×2): qty 100

## 2023-04-18 MED ORDER — LABETALOL HCL 5 MG/ML IV SOLN: 10.0000 mg | INTRAVENOUS | Status: DC | PRN

## 2023-04-18 MED ORDER — ONDANSETRON HCL 4 MG PO TABS: 4.0000 mg | ORAL_TABLET | ORAL | Status: DC | PRN

## 2023-04-18 MED ORDER — SODIUM CHLORIDE 0.9 % IV SOLN: INTRAVENOUS | Status: DC

## 2023-04-18 MED ORDER — HYDROCODONE-ACETAMINOPHEN 5-325 MG PO TABS
1.0000 | ORAL_TABLET | ORAL | Status: DC | PRN
  Administered 2023-04-18 – 2023-04-20 (×7): 1 via ORAL
  Filled 2023-04-18 (×8): qty 1

## 2023-04-18 MED ORDER — ACETAMINOPHEN 10 MG/ML IV SOLN
INTRAVENOUS | Status: AC
  Filled 2023-04-18: qty 100

## 2023-04-18 MED ORDER — ROCURONIUM BROMIDE 10 MG/ML (PF) SYRINGE
PREFILLED_SYRINGE | INTRAVENOUS | Status: AC
  Filled 2023-04-18: qty 10

## 2023-04-18 MED ORDER — CHLORHEXIDINE GLUCONATE 0.12 % MT SOLN
15.0000 mL | Freq: Once | OROMUCOSAL | Status: AC
  Administered 2023-04-18: 15 mL via OROMUCOSAL
  Filled 2023-04-18: qty 15

## 2023-04-18 MED ORDER — EPHEDRINE 5 MG/ML INJ
INTRAVENOUS | Status: AC
  Filled 2023-04-18: qty 5

## 2023-04-18 MED ORDER — HEMOSTATIC AGENTS (NO CHARGE) OPTIME
TOPICAL | Status: DC | PRN
Start: 2023-04-18 — End: 2023-04-18
  Administered 2023-04-18: 1 via TOPICAL

## 2023-04-18 MED ORDER — ACETAMINOPHEN 10 MG/ML IV SOLN
INTRAVENOUS | Status: DC | PRN
  Administered 2023-04-18: 1000 mg via INTRAVENOUS

## 2023-04-18 MED ORDER — SODIUM CHLORIDE 0.9 % IV SOLN
0.1500 ug/kg/min | INTRAVENOUS | Status: DC
  Filled 2023-04-18: qty 2000

## 2023-04-18 MED ORDER — THROMBIN 5000 UNITS EX SOLR
CUTANEOUS | Status: AC
  Filled 2023-04-18: qty 5000

## 2023-04-18 MED ORDER — THROMBIN 20000 UNITS EX SOLR: CUTANEOUS | Status: DC | PRN

## 2023-04-18 MED ORDER — THROMBIN 20000 UNITS EX SOLR
CUTANEOUS | Status: AC
  Filled 2023-04-18: qty 20000

## 2023-04-18 MED ORDER — LIDOCAINE 2% (20 MG/ML) 5 ML SYRINGE
INTRAMUSCULAR | Status: AC
  Filled 2023-04-18: qty 5

## 2023-04-18 MED ORDER — DEXAMETHASONE SODIUM PHOSPHATE 10 MG/ML IJ SOLN
6.0000 mg | Freq: Four times a day (QID) | INTRAMUSCULAR | Status: AC
  Administered 2023-04-18 – 2023-04-19 (×4): 6 mg via INTRAVENOUS
  Filled 2023-04-18 (×4): qty 1

## 2023-04-18 MED ORDER — ONDANSETRON HCL 4 MG/2ML IJ SOLN
INTRAMUSCULAR | Status: AC
  Filled 2023-04-18: qty 2

## 2023-04-18 MED ORDER — LIDOCAINE-EPINEPHRINE 1 %-1:100000 IJ SOLN
INTRAMUSCULAR | Status: DC | PRN
  Administered 2023-04-18: 9 mL

## 2023-04-18 MED ORDER — EPHEDRINE SULFATE-NACL 50-0.9 MG/10ML-% IV SOSY
PREFILLED_SYRINGE | INTRAVENOUS | Status: DC | PRN
  Administered 2023-04-18: 5 mg via INTRAVENOUS

## 2023-04-18 MED ORDER — LIDOCAINE 2% (20 MG/ML) 5 ML SYRINGE
INTRAMUSCULAR | Status: DC | PRN
  Administered 2023-04-18: 60 mg via INTRAVENOUS
  Administered 2023-04-18: 40 mg via INTRAVENOUS

## 2023-04-18 MED ORDER — MIDAZOLAM HCL 2 MG/2ML IJ SOLN
INTRAMUSCULAR | Status: AC
  Filled 2023-04-18: qty 2

## 2023-04-18 MED ORDER — ONDANSETRON HCL 4 MG/2ML IJ SOLN
INTRAMUSCULAR | Status: DC | PRN
  Administered 2023-04-18 (×2): 4 mg via INTRAVENOUS

## 2023-04-18 MED ORDER — DOCUSATE SODIUM 100 MG PO CAPS
100.0000 mg | ORAL_CAPSULE | Freq: Two times a day (BID) | ORAL | Status: DC
  Administered 2023-04-18 – 2023-04-20 (×4): 100 mg via ORAL
  Filled 2023-04-18 (×4): qty 1

## 2023-04-18 MED ORDER — DEXAMETHASONE SODIUM PHOSPHATE 4 MG/ML IJ SOLN
4.0000 mg | Freq: Four times a day (QID) | INTRAMUSCULAR | Status: AC
  Administered 2023-04-19 – 2023-04-20 (×4): 4 mg via INTRAVENOUS
  Filled 2023-04-18 (×4): qty 1

## 2023-04-18 MED ORDER — DROPERIDOL 2.5 MG/ML IJ SOLN: 0.6250 mg | Freq: Once | INTRAMUSCULAR | Status: DC | PRN

## 2023-04-18 MED ORDER — LEVETIRACETAM IN NACL 500 MG/100ML IV SOLN
500.0000 mg | Freq: Two times a day (BID) | INTRAVENOUS | Status: DC
  Administered 2023-04-18 – 2023-04-20 (×4): 500 mg via INTRAVENOUS
  Filled 2023-04-18 (×5): qty 100

## 2023-04-18 MED ORDER — BACITRACIN ZINC 500 UNIT/GM EX OINT
TOPICAL_OINTMENT | CUTANEOUS | Status: AC
  Filled 2023-04-18: qty 28.35

## 2023-04-18 MED ORDER — 0.9 % SODIUM CHLORIDE (POUR BTL) OPTIME
TOPICAL | Status: DC | PRN
Start: 2023-04-18 — End: 2023-04-18
  Administered 2023-04-18 (×3): 1000 mL

## 2023-04-18 MED ORDER — PROMETHAZINE HCL 25 MG PO TABS: 12.5000 mg | ORAL_TABLET | ORAL | Status: DC | PRN

## 2023-04-18 MED ORDER — SUGAMMADEX SODIUM 200 MG/2ML IV SOLN
INTRAVENOUS | Status: DC | PRN
  Administered 2023-04-18: 300 mg via INTRAVENOUS

## 2023-04-18 MED ORDER — DEXAMETHASONE SODIUM PHOSPHATE 4 MG/ML IJ SOLN: 4.0000 mg | Freq: Three times a day (TID) | INTRAMUSCULAR | Status: DC

## 2023-04-18 MED ORDER — LIDOCAINE-EPINEPHRINE 1 %-1:100000 IJ SOLN
INTRAMUSCULAR | Status: AC
  Filled 2023-04-18: qty 1

## 2023-04-18 MED ORDER — PROPOFOL 10 MG/ML IV BOLUS
INTRAVENOUS | Status: DC | PRN
  Administered 2023-04-18 (×3): 50 mg via INTRAVENOUS
  Administered 2023-04-18: 200 mg via INTRAVENOUS

## 2023-04-18 MED ORDER — ROCURONIUM BROMIDE 10 MG/ML (PF) SYRINGE
PREFILLED_SYRINGE | INTRAVENOUS | Status: DC | PRN
  Administered 2023-04-18: 30 mg via INTRAVENOUS
  Administered 2023-04-18: 20 mg via INTRAVENOUS
  Administered 2023-04-18 (×2): 30 mg via INTRAVENOUS
  Administered 2023-04-18: 20 mg via INTRAVENOUS
  Administered 2023-04-18: 70 mg via INTRAVENOUS
  Administered 2023-04-18: 20 mg via INTRAVENOUS
  Administered 2023-04-18: 30 mg via INTRAVENOUS

## 2023-04-18 MED ORDER — CEFAZOLIN SODIUM-DEXTROSE 2-3 GM-%(50ML) IV SOLR
INTRAVENOUS | Status: DC | PRN
Start: 2023-04-18 — End: 2023-04-18
  Administered 2023-04-18 (×2): 2 g via INTRAVENOUS

## 2023-04-18 MED ORDER — GLYCOPYRROLATE PF 0.2 MG/ML IJ SOSY
PREFILLED_SYRINGE | INTRAMUSCULAR | Status: AC
  Filled 2023-04-18: qty 1

## 2023-04-18 MED ORDER — PHENYLEPHRINE HCL-NACL 20-0.9 MG/250ML-% IV SOLN
INTRAVENOUS | Status: DC | PRN
  Administered 2023-04-18: 20 ug/min via INTRAVENOUS

## 2023-04-18 MED ORDER — FENTANYL CITRATE (PF) 250 MCG/5ML IJ SOLN
INTRAMUSCULAR | Status: AC
  Filled 2023-04-18: qty 5

## 2023-04-18 MED ORDER — ORAL CARE MOUTH RINSE: 15.0000 mL | Freq: Once | OROMUCOSAL | Status: AC

## 2023-04-18 MED ORDER — PHENYLEPHRINE 80 MCG/ML (10ML) SYRINGE FOR IV PUSH (FOR BLOOD PRESSURE SUPPORT)
PREFILLED_SYRINGE | INTRAVENOUS | Status: DC | PRN
  Administered 2023-04-18 (×2): 40 ug via INTRAVENOUS
  Administered 2023-04-18: 120 ug via INTRAVENOUS
  Administered 2023-04-18: 40 ug via INTRAVENOUS
  Administered 2023-04-18: 80 ug via INTRAVENOUS
  Administered 2023-04-18: 120 ug via INTRAVENOUS

## 2023-04-18 MED ORDER — SODIUM CHLORIDE 0.9 % IV SOLN
0.1500 ug/kg/min | INTRAVENOUS | Status: AC
  Administered 2023-04-18 (×2): .15 ug/kg/min via INTRAVENOUS
  Filled 2023-04-18: qty 2000

## 2023-04-18 MED ORDER — PROPOFOL 10 MG/ML IV BOLUS
INTRAVENOUS | Status: AC
  Filled 2023-04-18: qty 20

## 2023-04-18 MED ORDER — LEVETIRACETAM IN NACL 500 MG/100ML IV SOLN
500.0000 mg | Freq: Once | INTRAVENOUS | Status: AC
  Administered 2023-04-18: 500 mg via INTRAVENOUS
  Filled 2023-04-18: qty 100

## 2023-04-18 MED ORDER — FENTANYL CITRATE (PF) 250 MCG/5ML IJ SOLN
INTRAMUSCULAR | Status: DC | PRN
  Administered 2023-04-18: 25 ug via INTRAVENOUS
  Administered 2023-04-18: 50 ug via INTRAVENOUS
  Administered 2023-04-18: 150 ug via INTRAVENOUS
  Administered 2023-04-18: 25 ug via INTRAVENOUS

## 2023-04-18 MED ORDER — PHENYLEPHRINE 80 MCG/ML (10ML) SYRINGE FOR IV PUSH (FOR BLOOD PRESSURE SUPPORT)
PREFILLED_SYRINGE | INTRAVENOUS | Status: AC
  Filled 2023-04-18: qty 10

## 2023-04-18 MED ORDER — THROMBIN 5000 UNITS EX SOLR: OROMUCOSAL | Status: DC | PRN

## 2023-04-18 MED ORDER — LACTATED RINGERS IV SOLN: INTRAVENOUS | Status: DC

## 2023-04-18 MED ORDER — GLYCOPYRROLATE PF 0.2 MG/ML IJ SOSY
PREFILLED_SYRINGE | INTRAMUSCULAR | Status: DC | PRN
Start: 2023-04-18 — End: 2023-04-18
  Administered 2023-04-18: .1 mg via INTRAVENOUS

## 2023-04-18 MED ORDER — FENTANYL CITRATE (PF) 100 MCG/2ML IJ SOLN
25.0000 ug | INTRAMUSCULAR | Status: DC | PRN
  Administered 2023-04-18 (×2): 50 ug via INTRAVENOUS

## 2023-04-18 MED ORDER — PANTOPRAZOLE SODIUM 40 MG IV SOLR
40.0000 mg | Freq: Every day | INTRAVENOUS | Status: DC
  Administered 2023-04-18 – 2023-04-19 (×2): 40 mg via INTRAVENOUS
  Filled 2023-04-18 (×2): qty 10

## 2023-04-18 MED ORDER — ONDANSETRON HCL 4 MG/2ML IJ SOLN: 4.0000 mg | INTRAMUSCULAR | Status: DC | PRN

## 2023-04-18 MED ORDER — FENTANYL CITRATE (PF) 100 MCG/2ML IJ SOLN
INTRAMUSCULAR | Status: AC
  Filled 2023-04-18: qty 2

## 2023-04-18 MED ORDER — POTASSIUM CHLORIDE IN NACL 20-0.9 MEQ/L-% IV SOLN
INTRAVENOUS | Status: DC
  Filled 2023-04-18 (×3): qty 1000

## 2023-04-18 MED ORDER — BACITRACIN ZINC 500 UNIT/GM EX OINT
TOPICAL_OINTMENT | CUTANEOUS | Status: DC | PRN
  Administered 2023-04-18: 1 via TOPICAL

## 2023-04-18 MED ORDER — MIDAZOLAM HCL 2 MG/2ML IJ SOLN
INTRAMUSCULAR | Status: DC | PRN
  Administered 2023-04-18: 1 mg via INTRAVENOUS

## 2023-04-18 MED ORDER — HYDROMORPHONE HCL 1 MG/ML IJ SOLN
0.5000 mg | INTRAMUSCULAR | Status: DC | PRN
  Administered 2023-04-18 – 2023-04-20 (×5): 1 mg via INTRAVENOUS
  Filled 2023-04-18 (×5): qty 1

## 2023-04-18 MED ORDER — ALBUMIN HUMAN 5 % IV SOLN
INTRAVENOUS | Status: DC | PRN
Start: 2023-04-18 — End: 2023-04-18

## 2023-04-18 SURGICAL SUPPLY — 72 items
ADH SKN CLS APL DERMABOND .7 (GAUZE/BANDAGES/DRESSINGS) ×2
BAG COUNTER SPONGE SURGICOUNT (BAG) ×2 IMPLANT
BAG SPNG CNTER NS LX DISP (BAG) ×2
BLADE CLIPPER SURG (BLADE) ×2 IMPLANT
BLADE SURG 11 STRL SS (BLADE) ×2 IMPLANT
BNDG CMPR 75X41 PLY ABS (GAUZE/BANDAGES/DRESSINGS) ×2
BNDG CMPR 75X41 PLY HI ABS (GAUZE/BANDAGES/DRESSINGS) ×2
BNDG COHESIVE 4X5 TAN STRL (GAUZE/BANDAGES/DRESSINGS) ×2 IMPLANT
BNDG GAUZE DERMACEA FLUFF 4 (GAUZE/BANDAGES/DRESSINGS) IMPLANT
BNDG GZE DERMACEA 4 6PLY (GAUZE/BANDAGES/DRESSINGS) ×2
BNDG STRETCH 4X75 NS LF (GAUZE/BANDAGES/DRESSINGS) IMPLANT
BNDG STRETCH 4X75 STRL LF (GAUZE/BANDAGES/DRESSINGS) ×2 IMPLANT
BUR ACORN 9.0 PRECISION (BURR) ×2 IMPLANT
BUR SPIRAL ROUTER 2.3 (BUR) IMPLANT
CANISTER SUCT 3000ML PPV (MISCELLANEOUS) ×4 IMPLANT
CLIP TI MEDIUM 6 (CLIP) ×2 IMPLANT
CNTNR URN SCR LID CUP LEK RST (MISCELLANEOUS) ×2 IMPLANT
CONT SPEC 4OZ STRL OR WHT (MISCELLANEOUS) ×4
COVER BURR HOLE 14 (Orthopedic Implant) IMPLANT
COVERAGE SUPPORT O-ARM STEALTH (MISCELLANEOUS) ×2 IMPLANT
DERMABOND ADVANCED .7 DNX12 (GAUZE/BANDAGES/DRESSINGS) ×2 IMPLANT
DRAIN RELI 100 BL SUC LF ST (DRAIN) ×2
DRAPE MICROSCOPE SLANT 54X150 (MISCELLANEOUS) IMPLANT
DRAPE NEUROLOGICAL W/INCISE (DRAPES) ×2 IMPLANT
DRAPE STERI IOBAN 125X83 (DRAPES) IMPLANT
DRAPE WARM FLUID 44X44 (DRAPES) ×2 IMPLANT
DRSG OPSITE 4X5.5 SM (GAUZE/BANDAGES/DRESSINGS) IMPLANT
ELECT REM PT RETURN 9FT ADLT (ELECTROSURGICAL) ×2
ELECTRODE REM PT RTRN 9FT ADLT (ELECTROSURGICAL) ×2 IMPLANT
EVACUATOR SILICONE 100CC (DRAIN) IMPLANT
FEE COVERAGE SUPPORT O-ARM (MISCELLANEOUS) ×2 IMPLANT
FORCEPS BIPOLAR SPETZLER 8 1.0 (NEUROSURGERY SUPPLIES) IMPLANT
GAUZE 4X4 16PLY ~~LOC~~+RFID DBL (SPONGE) IMPLANT
GAUZE SPONGE 4X4 12PLY STRL (GAUZE/BANDAGES/DRESSINGS) ×2 IMPLANT
GLOVE BIO SURGEON STRL SZ 6.5 (GLOVE) ×2 IMPLANT
GLOVE BIO SURGEON STRL SZ8 (GLOVE) ×2 IMPLANT
GLOVE BIOGEL PI IND STRL 6.5 (GLOVE) IMPLANT
GLOVE INDICATOR 8.5 STRL (GLOVE) ×4 IMPLANT
GOWN STRL REUS W/ TWL LRG LVL3 (GOWN DISPOSABLE) ×2 IMPLANT
GOWN STRL REUS W/ TWL XL LVL3 (GOWN DISPOSABLE) ×2 IMPLANT
GOWN STRL REUS W/TWL 2XL LVL3 (GOWN DISPOSABLE) ×2 IMPLANT
GOWN STRL REUS W/TWL LRG LVL3 (GOWN DISPOSABLE) ×2
GOWN STRL REUS W/TWL XL LVL3 (GOWN DISPOSABLE) ×2
GRAFT DURAGEN MATRIX 5WX7L (Graft) IMPLANT
HEMOSTAT POWDER KIT SURGIFOAM (HEMOSTASIS) IMPLANT
HEMOSTAT SURGICEL 2X14 (HEMOSTASIS) IMPLANT
KIT BASIN OR (CUSTOM PROCEDURE TRAY) ×2 IMPLANT
KIT TURNOVER KIT B (KITS) ×2 IMPLANT
MARKER SPHERE PSV REFLC NDI (MISCELLANEOUS) ×6 IMPLANT
NDL HYPO 25X1 1.5 SAFETY (NEEDLE) ×2 IMPLANT
NEEDLE HYPO 25X1 1.5 SAFETY (NEEDLE) ×2 IMPLANT
NS IRRIG 1000ML POUR BTL (IV SOLUTION) ×4 IMPLANT
PACK CRANIOTOMY CUSTOM (CUSTOM PROCEDURE TRAY) ×2 IMPLANT
PAD ARMBOARD 7.5X6 YLW CONV (MISCELLANEOUS) ×6 IMPLANT
PATTIES SURGICAL .25X.25 (GAUZE/BANDAGES/DRESSINGS) IMPLANT
PATTIES SURGICAL .5 X.5 (GAUZE/BANDAGES/DRESSINGS) IMPLANT
PATTIES SURGICAL .5 X3 (DISPOSABLE) IMPLANT
PATTIES SURGICAL 1X1 (DISPOSABLE) IMPLANT
SCREW UNIII AXS SD 1.5X4 (Screw) IMPLANT
SPIKE FLUID TRANSFER (MISCELLANEOUS) ×2 IMPLANT
SPONGE NEURO XRAY DETECT 1X3 (DISPOSABLE) IMPLANT
SPONGE SURGIFOAM ABS GEL 100 (HEMOSTASIS) IMPLANT
STAPLER VISISTAT 35W (STAPLE) ×2 IMPLANT
SUT NURALON 4 0 TR CR/8 (SUTURE) ×6 IMPLANT
SUT VIC AB 2-0 CT1 18 (SUTURE) ×2 IMPLANT
TOWEL GREEN STERILE (TOWEL DISPOSABLE) ×2 IMPLANT
TOWEL GREEN STERILE FF (TOWEL DISPOSABLE) IMPLANT
TRAY FOLEY MTR SLVR 16FR STAT (SET/KITS/TRAYS/PACK) ×2 IMPLANT
TUBE CONNECTING 12X1/4 (SUCTIONS) ×2 IMPLANT
TUBING FEATHERFLOW (TUBING) IMPLANT
UNDERPAD 30X36 HEAVY ABSORB (UNDERPADS AND DIAPERS) ×2 IMPLANT
WATER STERILE IRR 1000ML POUR (IV SOLUTION) ×2 IMPLANT

## 2023-04-18 NOTE — Anesthesia Postprocedure Evaluation (Signed)
Anesthesia Post Note  Patient: Glenn Hoffman  Procedure(s) Performed: Right middle meningeal artery embolization     Patient location during evaluation: PACU Anesthesia Type: General Level of consciousness: awake Pain management: pain level controlled Vital Signs Assessment: post-procedure vital signs reviewed and stable Respiratory status: spontaneous breathing, nonlabored ventilation and respiratory function stable Cardiovascular status: blood pressure returned to baseline and stable Postop Assessment: no apparent nausea or vomiting Anesthetic complications: no   No notable events documented.  Last Vitals:  Vitals:   04/18/23 0500 04/18/23 0600  BP:  117/70  Pulse: 82 (!) 53  Resp: (!) 23 15  Temp:    SpO2: 94% 94%    Last Pain:  Vitals:   04/18/23 0400  TempSrc: Oral  PainSc: 0-No pain                 Kenniel Bergsma P Frankie Scipio

## 2023-04-18 NOTE — Transfer of Care (Signed)
Immediate Anesthesia Transfer of Care Note  Patient: Glenn Hoffman  Procedure(s) Performed: OCCIPITAL PARIETAL CRANI FOR RESECTION OF PARASAGITTAL CONVEXITY MENINGIOMA (Right: Head) APPLICATION OF CRANIAL NAVIGATION  Patient Location: PACU  Anesthesia Type:General  Level of Consciousness: awake, alert , and oriented  Airway & Oxygen Therapy: Patient Spontanous Breathing and Patient connected to face mask oxygen  Post-op Assessment: Report given to RN and Post -op Vital signs reviewed and stable  Post vital signs: Reviewed and stable  Last Vitals:  Vitals Value Taken Time  BP 133/81 04/18/23 1440  Temp 36.2 C 04/18/23 1440  Pulse 89 04/18/23 1443  Resp 14 04/18/23 1443  SpO2 100 % 04/18/23 1443  Vitals shown include unfiled device data.  Last Pain:  Vitals:   04/18/23 0738  TempSrc: Oral  PainSc:          Complications: No notable events documented.

## 2023-04-18 NOTE — H&P (Signed)
Glenn Hoffman is an 32 y.o. male.   Chief Complaint: Dizziness headaches HPI: 32 year old gentleman with right parasagittal convexity meningioma.  This measures about 6 cm partial invasion of the superior sagittal sinus.  Due to the patient's imaging progression of clinical syndrome I recommended craniotomy for resection we preoperatively embolized this and that procedure went well Dr. Conchita Paris was able to embolize 2 of the 3 major feeding vessels.  I have extensively gone over the risks and benefits of this resection with the patient and his wife as well as perioperative course expectations of outcome and alternatives to surgery and they understood and agreed to proceed forward.  Past Medical History:  Diagnosis Date   ADHD    Anxiety    Depression     Past Surgical History:  Procedure Laterality Date   IR ANGIO EXTERNAL CAROTID SEL EXT CAROTID UNI R MOD SED  04/17/2023   IR ANGIO INTRA EXTRACRAN SEL INTERNAL CAROTID UNI R MOD SED  04/17/2023   IR ANGIOGRAM FOLLOW UP STUDY  04/17/2023   IR NEURO EACH ADD'L AFTER BASIC UNI RIGHT (MS)  04/17/2023   IR TRANSCATH/EMBOLIZ  04/17/2023   SKIN BIOPSY Right    Right knee- in derm office    Family History  Problem Relation Age of Onset   Stroke Paternal Grandfather    Hemophilia Maternal Great-grandmother    Social History:  reports that he has never smoked. He has never used smokeless tobacco. He reports current alcohol use of about 7.0 standard drinks of alcohol per week. He reports current drug use. Drug: Marijuana.  Allergies: No Known Allergies  Medications Prior to Admission  Medication Sig Dispense Refill   escitalopram (LEXAPRO) 10 MG tablet Take 10 mg by mouth daily.     ibuprofen (ADVIL) 200 MG tablet Take 200 mg by mouth as needed for moderate pain or headache.     naproxen sodium (ALEVE) 220 MG tablet Take 220 mg by mouth as needed (headache/pain).     OVER THE COUNTER MEDICATION Take 15 mg by mouth 2 (two) times daily as needed  (anxiety). THC gummy 15 mg      Results for orders placed or performed during the hospital encounter of 04/17/23 (from the past 48 hour(s))  ABO/Rh     Status: None   Collection Time: 04/17/23  8:05 AM  Result Value Ref Range   ABO/RH(D)      A POS Performed at [facility], [address], [place], [place] [address]   MRSA Next Gen by PCR, Nasal     Status: None   Collection Time: 04/17/23  2:09 PM   Specimen: Nasal Mucosa; Nasal Swab  Result Value Ref Range   MRSA by PCR Next Gen NOT DETECTED NOT DETECTED    Comment: (NOTE) The GeneXpert MRSA Assay (FDA approved for NASAL specimens only), is one component of a comprehensive MRSA colonization surveillance program. It is not intended to diagnose MRSA infection nor to guide or monitor treatment for MRSA infections. Test performance is not FDA approved in patients less than 81 years old. Performed at [facility], [address] [place], [place] [address]    IR Transcath/Emboliz  Result Date: 04/17/2023 PROCEDURE: ONYX EMBOLIZATION OF RIGHT MIDDLE MENINGEAL ARTERY HISTORY: The patient is a 32 year old man a large right parietal meningioma. The patient was seen in the Outpatient neurosurgery clinic where surgical resection of the tumor was recommended. Preoperative middle meningeal artery embolization was requested. ACCESS: The technical aspects of  the procedure as well as its potential risks and benefits were reviewed with the patient and family. These risks included but were not limited to stroke, intracranial hemorrhage, bleeding, infection, allergic reaction, damage to organs or vital structures, stroke, non-diagnostic procedure, and the catastrophic outcomes of heart attack, coma, and death. With an understanding of these risks, informed consent was obtained and witnessed. The patient was placed in the supine position on the angiography table and the skin of right groin prepped in the usual sterile fashion. The procedure  was performed under general anesthesia. A 5-French sheath was introduced in the right common femoral artery using Seldinger technique. MEDICATIONS: HEPARIN: 2000 Units total. CONTRAST:  42mL OMNIPAQUE IOHEXOL 300 MG/ML  SOLNcc, Omnipaque 300 FLUOROSCOPY TIME:  FLUOROSCOPY TIME: See IR records TECHNIQUE: CATHETERS AND WIRES 5-French Envoy guide catheter 180 cm 0.035" glidewire Apollo microcatheter Synchro 10 microwire EMBOLIC AGENT USED Onyx 18 VESSELS CATHETERIZED Right internal carotid artery Right external carotid artery Right middle meningeal artery Right common femoral VESSELS STUDIED Right internal carotid artery Right external carotid artery Right middle meningeal artery, microcatheter run Right external carotid artery, post-embolization Right common femoral PROCEDURAL NARRATIVE The guide catheter was introduced over the Glidewire and the right external carotid artery was selected. Angiogram was taken. The right internal carotid artery was then selected and angiogram was taken. After review of the images, I elected to proceed with the embolization. Under roadmap guidance, the guide catheter was again positioned in the right external carotid artery. The microcatheter was introduced over the microwire and the right middle meningeal artery was selected. Microcatheter was then taken to confirm no perfusion to the underlying brain. The catheter was then flushed with DMSO, and under standard roadmap and blank roadmap technique, the middle meningeal artery was embolized. The microcatheter was then removed without incident. Post embolization angiogram was taken through the guide catheter. A second more posteriorly directed branch of the middle meningeal artery was also noted supplying the tumor. Multiple attempts were made to catheterize this vessel with a second Apollo microcatheter over the synchro wire. Unfortunately due to tortuosity at the origin of this more posteriorly directed vessel I was unable to  catheterize this branch. I therefore removed the Apollo microcatheter and micro. After review of the images, the guide catheter was removed without incident. FINDINGS: Right internal carotid, head: Injection reveals the presence of a widely patent ICA, M1, and A1 segments and their branches. Of note, the ophthalmic artery has normal origin from the internal carotid. No aneurysms, AVMs, or high-flow fistulas are seen. Interestingly, there are small extracranial branches arising from the anterior surface of the cervical internal carotid which appeared to supply soft tissues within the prevertebral space. The parenchymal and venous phases are normal. The venous sinuses are widely patent. Right external carotid head: Visualized cranial branches of the external carotid artery are unremarkable. The middle meningeal artery is identified arising from internal maxillary artery. No opacification of the pial veins or dural venous sinuses is seen to suggest any brain perfusion. Right middle meningeal artery, microcatheter run: Angiogram taken from the microcatheter within the middle meningeal artery does not reveal any perfusion of the underlying brain. There are 3 branches of the middle meningeal artery coursing posteriorly which supply the known parasagittal tumor. Right external carotid, post embolization: Angiogram taken from the guide catheter in the external carotid artery reveals onyx cast within the 2 anterior branches of the middle meningeal artery. A single more posteriorly directed branch remains patent and does provide some  supply to the posterior aspect of the meningioma. Remainder of the cranial branches of the external carotid artery are unremarkable. Right femoral: Normal vessel. No significant atherosclerotic disease. Arterial sheath in adequate position. DISPOSITION: Upon completion of the study, the femoral sheath was removed and hemostasis obtained using a 5-Fr ExoSeal closure device. Good proximal and  distal lower extremity pulses were documented upon achievement of hemostasis. The procedure was well tolerated and no early complications were observed. The patient was transferred to the postanesthesia care unit in stable hemodynamic condition. IMPRESSION: 1. Successful onyx embolization of the right middle meningeal artery for meningioma as a preoperative adjunct. The preliminary results of this procedure were shared with the patient's family. Electronically Signed   By: Lisbeth Renshaw   On: 04/17/2023 13:54   IR ANGIO EXTERNAL CAROTID SEL EXT CAROTID UNI R MOD SED  Result Date: 04/17/2023 PROCEDURE: ONYX EMBOLIZATION OF RIGHT MIDDLE MENINGEAL ARTERY HISTORY: The patient is a 32 year old man a large right parietal meningioma. The patient was seen in the Outpatient neurosurgery clinic where surgical resection of the tumor was recommended. Preoperative middle meningeal artery embolization was requested. ACCESS: The technical aspects of the procedure as well as its potential risks and benefits were reviewed with the patient and family. These risks included but were not limited to stroke, intracranial hemorrhage, bleeding, infection, allergic reaction, damage to organs or vital structures, stroke, non-diagnostic procedure, and the catastrophic outcomes of heart attack, coma, and death. With an understanding of these risks, informed consent was obtained and witnessed. The patient was placed in the supine position on the angiography table and the skin of right groin prepped in the usual sterile fashion. The procedure was performed under general anesthesia. A 5-French sheath was introduced in the right common femoral artery using Seldinger technique. MEDICATIONS: HEPARIN: 2000 Units total. CONTRAST:  42mL OMNIPAQUE IOHEXOL 300 MG/ML  SOLNcc, Omnipaque 300 FLUOROSCOPY TIME:  FLUOROSCOPY TIME: See IR records TECHNIQUE: CATHETERS AND WIRES 5-French Envoy guide catheter 180 cm 0.035" glidewire Apollo microcatheter  Synchro 10 microwire EMBOLIC AGENT USED Onyx 18 VESSELS CATHETERIZED Right internal carotid artery Right external carotid artery Right middle meningeal artery Right common femoral VESSELS STUDIED Right internal carotid artery Right external carotid artery Right middle meningeal artery, microcatheter run Right external carotid artery, post-embolization Right common femoral PROCEDURAL NARRATIVE The guide catheter was introduced over the Glidewire and the right external carotid artery was selected. Angiogram was taken. The right internal carotid artery was then selected and angiogram was taken. After review of the images, I elected to proceed with the embolization. Under roadmap guidance, the guide catheter was again positioned in the right external carotid artery. The microcatheter was introduced over the microwire and the right middle meningeal artery was selected. Microcatheter was then taken to confirm no perfusion to the underlying brain. The catheter was then flushed with DMSO, and under standard roadmap and blank roadmap technique, the middle meningeal artery was embolized. The microcatheter was then removed without incident. Post embolization angiogram was taken through the guide catheter. A second more posteriorly directed branch of the middle meningeal artery was also noted supplying the tumor. Multiple attempts were made to catheterize this vessel with a second Apollo microcatheter over the synchro wire. Unfortunately due to tortuosity at the origin of this more posteriorly directed vessel I was unable to catheterize this branch. I therefore removed the Apollo microcatheter and micro. After review of the images, the guide catheter was removed without incident. FINDINGS: Right internal carotid, head: Injection reveals  the presence of a widely patent ICA, M1, and A1 segments and their branches. Of note, the ophthalmic artery has normal origin from the internal carotid. No aneurysms, AVMs, or high-flow fistulas  are seen. Interestingly, there are small extracranial branches arising from the anterior surface of the cervical internal carotid which appeared to supply soft tissues within the prevertebral space. The parenchymal and venous phases are normal. The venous sinuses are widely patent. Right external carotid head: Visualized cranial branches of the external carotid artery are unremarkable. The middle meningeal artery is identified arising from internal maxillary artery. No opacification of the pial veins or dural venous sinuses is seen to suggest any brain perfusion. Right middle meningeal artery, microcatheter run: Angiogram taken from the microcatheter within the middle meningeal artery does not reveal any perfusion of the underlying brain. There are 3 branches of the middle meningeal artery coursing posteriorly which supply the known parasagittal tumor. Right external carotid, post embolization: Angiogram taken from the guide catheter in the external carotid artery reveals onyx cast within the 2 anterior branches of the middle meningeal artery. A single more posteriorly directed branch remains patent and does provide some supply to the posterior aspect of the meningioma. Remainder of the cranial branches of the external carotid artery are unremarkable. Right femoral: Normal vessel. No significant atherosclerotic disease. Arterial sheath in adequate position. DISPOSITION: Upon completion of the study, the femoral sheath was removed and hemostasis obtained using a 5-Fr ExoSeal closure device. Good proximal and distal lower extremity pulses were documented upon achievement of hemostasis. The procedure was well tolerated and no early complications were observed. The patient was transferred to the postanesthesia care unit in stable hemodynamic condition. IMPRESSION: 1. Successful onyx embolization of the right middle meningeal artery for meningioma as a preoperative adjunct. The preliminary results of this procedure were  shared with the patient's family. Electronically Signed   By: Lisbeth Renshaw   On: 04/17/2023 13:54   IR ANGIO INTRA EXTRACRAN SEL INTERNAL CAROTID UNI R MOD SED  Result Date: 04/17/2023 PROCEDURE: ONYX EMBOLIZATION OF RIGHT MIDDLE MENINGEAL ARTERY HISTORY: The patient is a 32 year old man a large right parietal meningioma. The patient was seen in the Outpatient neurosurgery clinic where surgical resection of the tumor was recommended. Preoperative middle meningeal artery embolization was requested. ACCESS: The technical aspects of the procedure as well as its potential risks and benefits were reviewed with the patient and family. These risks included but were not limited to stroke, intracranial hemorrhage, bleeding, infection, allergic reaction, damage to organs or vital structures, stroke, non-diagnostic procedure, and the catastrophic outcomes of heart attack, coma, and death. With an understanding of these risks, informed consent was obtained and witnessed. The patient was placed in the supine position on the angiography table and the skin of right groin prepped in the usual sterile fashion. The procedure was performed under general anesthesia. A 5-French sheath was introduced in the right common femoral artery using Seldinger technique. MEDICATIONS: HEPARIN: 2000 Units total. CONTRAST:  42mL OMNIPAQUE IOHEXOL 300 MG/ML  SOLNcc, Omnipaque 300 FLUOROSCOPY TIME:  FLUOROSCOPY TIME: See IR records TECHNIQUE: CATHETERS AND WIRES 5-French Envoy guide catheter 180 cm 0.035" glidewire Apollo microcatheter Synchro 10 microwire EMBOLIC AGENT USED Onyx 18 VESSELS CATHETERIZED Right internal carotid artery Right external carotid artery Right middle meningeal artery Right common femoral VESSELS STUDIED Right internal carotid artery Right external carotid artery Right middle meningeal artery, microcatheter run Right external carotid artery, post-embolization Right common femoral PROCEDURAL NARRATIVE The guide catheter  was  introduced over the Glidewire and the right external carotid artery was selected. Angiogram was taken. The right internal carotid artery was then selected and angiogram was taken. After review of the images, I elected to proceed with the embolization. Under roadmap guidance, the guide catheter was again positioned in the right external carotid artery. The microcatheter was introduced over the microwire and the right middle meningeal artery was selected. Microcatheter was then taken to confirm no perfusion to the underlying brain. The catheter was then flushed with DMSO, and under standard roadmap and blank roadmap technique, the middle meningeal artery was embolized. The microcatheter was then removed without incident. Post embolization angiogram was taken through the guide catheter. A second more posteriorly directed branch of the middle meningeal artery was also noted supplying the tumor. Multiple attempts were made to catheterize this vessel with a second Apollo microcatheter over the synchro wire. Unfortunately due to tortuosity at the origin of this more posteriorly directed vessel I was unable to catheterize this branch. I therefore removed the Apollo microcatheter and micro. After review of the images, the guide catheter was removed without incident. FINDINGS: Right internal carotid, head: Injection reveals the presence of a widely patent ICA, M1, and A1 segments and their branches. Of note, the ophthalmic artery has normal origin from the internal carotid. No aneurysms, AVMs, or high-flow fistulas are seen. Interestingly, there are small extracranial branches arising from the anterior surface of the cervical internal carotid which appeared to supply soft tissues within the prevertebral space. The parenchymal and venous phases are normal. The venous sinuses are widely patent. Right external carotid head: Visualized cranial branches of the external carotid artery are unremarkable. The middle meningeal  artery is identified arising from internal maxillary artery. No opacification of the pial veins or dural venous sinuses is seen to suggest any brain perfusion. Right middle meningeal artery, microcatheter run: Angiogram taken from the microcatheter within the middle meningeal artery does not reveal any perfusion of the underlying brain. There are 3 branches of the middle meningeal artery coursing posteriorly which supply the known parasagittal tumor. Right external carotid, post embolization: Angiogram taken from the guide catheter in the external carotid artery reveals onyx cast within the 2 anterior branches of the middle meningeal artery. A single more posteriorly directed branch remains patent and does provide some supply to the posterior aspect of the meningioma. Remainder of the cranial branches of the external carotid artery are unremarkable. Right femoral: Normal vessel. No significant atherosclerotic disease. Arterial sheath in adequate position. DISPOSITION: Upon completion of the study, the femoral sheath was removed and hemostasis obtained using a 5-Fr ExoSeal closure device. Good proximal and distal lower extremity pulses were documented upon achievement of hemostasis. The procedure was well tolerated and no early complications were observed. The patient was transferred to the postanesthesia care unit in stable hemodynamic condition. IMPRESSION: 1. Successful onyx embolization of the right middle meningeal artery for meningioma as a preoperative adjunct. The preliminary results of this procedure were shared with the patient's family. Electronically Signed   By: Lisbeth Renshaw   On: 04/17/2023 13:54   IR Angiogram Follow Up Study  Result Date: 04/17/2023 PROCEDURE: ONYX EMBOLIZATION OF RIGHT MIDDLE MENINGEAL ARTERY HISTORY: The patient is a 32 year old man a large right parietal meningioma. The patient was seen in the Outpatient neurosurgery clinic where surgical resection of the tumor was  recommended. Preoperative middle meningeal artery embolization was requested. ACCESS: The technical aspects of the procedure as well as its potential risks and  benefits were reviewed with the patient and family. These risks included but were not limited to stroke, intracranial hemorrhage, bleeding, infection, allergic reaction, damage to organs or vital structures, stroke, non-diagnostic procedure, and the catastrophic outcomes of heart attack, coma, and death. With an understanding of these risks, informed consent was obtained and witnessed. The patient was placed in the supine position on the angiography table and the skin of right groin prepped in the usual sterile fashion. The procedure was performed under general anesthesia. A 5-French sheath was introduced in the right common femoral artery using Seldinger technique. MEDICATIONS: HEPARIN: 2000 Units total. CONTRAST:  42mL OMNIPAQUE IOHEXOL 300 MG/ML  SOLNcc, Omnipaque 300 FLUOROSCOPY TIME:  FLUOROSCOPY TIME: See IR records TECHNIQUE: CATHETERS AND WIRES 5-French Envoy guide catheter 180 cm 0.035" glidewire Apollo microcatheter Synchro 10 microwire EMBOLIC AGENT USED Onyx 18 VESSELS CATHETERIZED Right internal carotid artery Right external carotid artery Right middle meningeal artery Right common femoral VESSELS STUDIED Right internal carotid artery Right external carotid artery Right middle meningeal artery, microcatheter run Right external carotid artery, post-embolization Right common femoral PROCEDURAL NARRATIVE The guide catheter was introduced over the Glidewire and the right external carotid artery was selected. Angiogram was taken. The right internal carotid artery was then selected and angiogram was taken. After review of the images, I elected to proceed with the embolization. Under roadmap guidance, the guide catheter was again positioned in the right external carotid artery. The microcatheter was introduced over the microwire and the right middle  meningeal artery was selected. Microcatheter was then taken to confirm no perfusion to the underlying brain. The catheter was then flushed with DMSO, and under standard roadmap and blank roadmap technique, the middle meningeal artery was embolized. The microcatheter was then removed without incident. Post embolization angiogram was taken through the guide catheter. A second more posteriorly directed branch of the middle meningeal artery was also noted supplying the tumor. Multiple attempts were made to catheterize this vessel with a second Apollo microcatheter over the synchro wire. Unfortunately due to tortuosity at the origin of this more posteriorly directed vessel I was unable to catheterize this branch. I therefore removed the Apollo microcatheter and micro. After review of the images, the guide catheter was removed without incident. FINDINGS: Right internal carotid, head: Injection reveals the presence of a widely patent ICA, M1, and A1 segments and their branches. Of note, the ophthalmic artery has normal origin from the internal carotid. No aneurysms, AVMs, or high-flow fistulas are seen. Interestingly, there are small extracranial branches arising from the anterior surface of the cervical internal carotid which appeared to supply soft tissues within the prevertebral space. The parenchymal and venous phases are normal. The venous sinuses are widely patent. Right external carotid head: Visualized cranial branches of the external carotid artery are unremarkable. The middle meningeal artery is identified arising from internal maxillary artery. No opacification of the pial veins or dural venous sinuses is seen to suggest any brain perfusion. Right middle meningeal artery, microcatheter run: Angiogram taken from the microcatheter within the middle meningeal artery does not reveal any perfusion of the underlying brain. There are 3 branches of the middle meningeal artery coursing posteriorly which supply the known  parasagittal tumor. Right external carotid, post embolization: Angiogram taken from the guide catheter in the external carotid artery reveals onyx cast within the 2 anterior branches of the middle meningeal artery. A single more posteriorly directed branch remains patent and does provide some supply to the posterior aspect of the meningioma. Remainder  of the cranial branches of the external carotid artery are unremarkable. Right femoral: Normal vessel. No significant atherosclerotic disease. Arterial sheath in adequate position. DISPOSITION: Upon completion of the study, the femoral sheath was removed and hemostasis obtained using a 5-Fr ExoSeal closure device. Good proximal and distal lower extremity pulses were documented upon achievement of hemostasis. The procedure was well tolerated and no early complications were observed. The patient was transferred to the postanesthesia care unit in stable hemodynamic condition. IMPRESSION: 1. Successful onyx embolization of the right middle meningeal artery for meningioma as a preoperative adjunct. The preliminary results of this procedure were shared with the patient's family. Electronically Signed   By: Lisbeth Renshaw   On: 04/17/2023 13:54   IR NEURO EACH ADD'L AFTER BASIC UNI RIGHT (MS)  Result Date: 04/17/2023 PROCEDURE: ONYX EMBOLIZATION OF RIGHT MIDDLE MENINGEAL ARTERY HISTORY: The patient is a 32 year old man a large right parietal meningioma. The patient was seen in the Outpatient neurosurgery clinic where surgical resection of the tumor was recommended. Preoperative middle meningeal artery embolization was requested. ACCESS: The technical aspects of the procedure as well as its potential risks and benefits were reviewed with the patient and family. These risks included but were not limited to stroke, intracranial hemorrhage, bleeding, infection, allergic reaction, damage to organs or vital structures, stroke, non-diagnostic procedure, and the catastrophic  outcomes of heart attack, coma, and death. With an understanding of these risks, informed consent was obtained and witnessed. The patient was placed in the supine position on the angiography table and the skin of right groin prepped in the usual sterile fashion. The procedure was performed under general anesthesia. A 5-French sheath was introduced in the right common femoral artery using Seldinger technique. MEDICATIONS: HEPARIN: 2000 Units total. CONTRAST:  42mL OMNIPAQUE IOHEXOL 300 MG/ML  SOLNcc, Omnipaque 300 FLUOROSCOPY TIME:  FLUOROSCOPY TIME: See IR records TECHNIQUE: CATHETERS AND WIRES 5-French Envoy guide catheter 180 cm 0.035" glidewire Apollo microcatheter Synchro 10 microwire EMBOLIC AGENT USED Onyx 18 VESSELS CATHETERIZED Right internal carotid artery Right external carotid artery Right middle meningeal artery Right common femoral VESSELS STUDIED Right internal carotid artery Right external carotid artery Right middle meningeal artery, microcatheter run Right external carotid artery, post-embolization Right common femoral PROCEDURAL NARRATIVE The guide catheter was introduced over the Glidewire and the right external carotid artery was selected. Angiogram was taken. The right internal carotid artery was then selected and angiogram was taken. After review of the images, I elected to proceed with the embolization. Under roadmap guidance, the guide catheter was again positioned in the right external carotid artery. The microcatheter was introduced over the microwire and the right middle meningeal artery was selected. Microcatheter was then taken to confirm no perfusion to the underlying brain. The catheter was then flushed with DMSO, and under standard roadmap and blank roadmap technique, the middle meningeal artery was embolized. The microcatheter was then removed without incident. Post embolization angiogram was taken through the guide catheter. A second more posteriorly directed branch of the middle  meningeal artery was also noted supplying the tumor. Multiple attempts were made to catheterize this vessel with a second Apollo microcatheter over the synchro wire. Unfortunately due to tortuosity at the origin of this more posteriorly directed vessel I was unable to catheterize this branch. I therefore removed the Apollo microcatheter and micro. After review of the images, the guide catheter was removed without incident. FINDINGS: Right internal carotid, head: Injection reveals the presence of a widely patent ICA, M1, and A1 segments  and their branches. Of note, the ophthalmic artery has normal origin from the internal carotid. No aneurysms, AVMs, or high-flow fistulas are seen. Interestingly, there are small extracranial branches arising from the anterior surface of the cervical internal carotid which appeared to supply soft tissues within the prevertebral space. The parenchymal and venous phases are normal. The venous sinuses are widely patent. Right external carotid head: Visualized cranial branches of the external carotid artery are unremarkable. The middle meningeal artery is identified arising from internal maxillary artery. No opacification of the pial veins or dural venous sinuses is seen to suggest any brain perfusion. Right middle meningeal artery, microcatheter run: Angiogram taken from the microcatheter within the middle meningeal artery does not reveal any perfusion of the underlying brain. There are 3 branches of the middle meningeal artery coursing posteriorly which supply the known parasagittal tumor. Right external carotid, post embolization: Angiogram taken from the guide catheter in the external carotid artery reveals onyx cast within the 2 anterior branches of the middle meningeal artery. A single more posteriorly directed branch remains patent and does provide some supply to the posterior aspect of the meningioma. Remainder of the cranial branches of the external carotid artery are  unremarkable. Right femoral: Normal vessel. No significant atherosclerotic disease. Arterial sheath in adequate position. DISPOSITION: Upon completion of the study, the femoral sheath was removed and hemostasis obtained using a 5-Fr ExoSeal closure device. Good proximal and distal lower extremity pulses were documented upon achievement of hemostasis. The procedure was well tolerated and no early complications were observed. The patient was transferred to the postanesthesia care unit in stable hemodynamic condition. IMPRESSION: 1. Successful onyx embolization of the right middle meningeal artery for meningioma as a preoperative adjunct. The preliminary results of this procedure were shared with the patient's family. Electronically Signed   By: Lisbeth Renshaw   On: 04/17/2023 13:54    Review of Systems  Neurological:  Positive for dizziness and headaches.    Blood pressure 120/79, pulse 62, temperature 98.4 F (36.9 C), temperature source Oral, resp. rate 15, height 6\' 1"  (1.854 m), weight 76.2 kg, SpO2 96%. Physical Exam HENT:     Head: Normocephalic.     Right Ear: Tympanic membrane normal.     Nose: Nose normal.     Mouth/Throat:     Mouth: Mucous membranes are moist.  Eyes:     Pupils: Pupils are equal, round, and reactive to light.  Cardiovascular:     Rate and Rhythm: Normal rate.  Pulmonary:     Effort: Pulmonary effort is normal.  Abdominal:     General: Abdomen is flat.  Musculoskeletal:        General: Normal range of motion.  Skin:    General: Skin is warm.  Neurological:     Mental Status: He is alert.     Comments: Patient is awake and alert pupils are equal cranial nerves are intact strength is 5 out of 5 upper and lower extremities bilaterally.  Patient is neurologically intact      Assessment/Plan 32 year old presents for craniotomy for resection of parasagittal meningioma  Mariam Dollar, MD 04/18/2023, 8:01 AM

## 2023-04-18 NOTE — Anesthesia Procedure Notes (Signed)
Procedure Name: Intubation Date/Time: 04/18/2023 8:48 AM  Performed by: Sharyn Dross, CRNAPre-anesthesia Checklist: Patient identified, Emergency Drugs available, Suction available and Patient being monitored Patient Re-evaluated:Patient Re-evaluated prior to induction Oxygen Delivery Method: Circle system utilized Preoxygenation: Pre-oxygenation with 100% oxygen Induction Type: IV induction Ventilation: Mask ventilation without difficulty Laryngoscope Size: Mac and 4 Grade View: Grade I Tube type: Oral Tube size: 7.5 mm Number of attempts: 1 Airway Equipment and Method: Stylet and Oral airway Placement Confirmation: ETT inserted through vocal cords under direct vision, positive ETCO2 and breath sounds checked- equal and bilateral Secured at: 23 cm Tube secured with: Tape Dental Injury: Teeth and Oropharynx as per pre-operative assessment

## 2023-04-18 NOTE — Progress Notes (Signed)
Alerted Dr. Wynetta Emery that JP drain was not holding a suction charge.  Was given orders to attempt and tape drain and if that didn't work to alert NP Leo Grosser to assess.  NP came bedside and changed drain.

## 2023-04-18 NOTE — Anesthesia Postprocedure Evaluation (Signed)
Anesthesia Post Note  Patient: Glenn Hoffman  Procedure(s) Performed: OCCIPITAL PARIETAL CRANI FOR RESECTION OF PARASAGITTAL CONVEXITY MENINGIOMA (Right: Head) APPLICATION OF CRANIAL NAVIGATION     Patient location during evaluation: PACU Anesthesia Type: General Level of consciousness: awake and alert Pain management: pain level controlled Vital Signs Assessment: post-procedure vital signs reviewed and stable Respiratory status: spontaneous breathing, nonlabored ventilation, respiratory function stable and patient connected to nasal cannula oxygen Cardiovascular status: blood pressure returned to baseline and stable Postop Assessment: no apparent nausea or vomiting Anesthetic complications: no  There were no known notable events for this encounter.  Last Vitals:  Vitals:   04/18/23 1510 04/18/23 1520  BP: 134/84 137/77  Pulse: 68 92  Resp: 11 15  Temp:  (!) 36.2 C  SpO2: 95% 96%    Last Pain:  Vitals:   04/18/23 1510  TempSrc:   PainSc: 7                  Kennieth Rad

## 2023-04-18 NOTE — Op Note (Signed)
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  Postoperative diagnosis: Same.  Procedure: #1 stereotactic craniotomy for resection of right parasagittal convexity meningioma utilizing the Stealth stereotactic navigation system.  Surgeon: Donalee Citrin.  Assistant: Julien Girt.  Anesthesia: General.  EBL: 700.  HPI: 32 year old gentleman with dizziness and headaches workup revealed large 6 cm parasagittal meningioma with sinus invasion and presumptive anal Trolard coursing through the tumor.  Due to patient's progression of clinical syndrome imaging findings I recommended resection extensively with the risks and benefits of the operation with him as well as perioperative course expectations of outcome and alternatives to surgery and he understood and agreed to proceed forward.  I did explain to him that due to the sinus invasion and the fact the sinus was patent and this part of the sinus were unable to take the sinus so we would have to leave a rim of tumor along the lateral margin of the superior sagittal sinus and follow this up serially possibly even with radiation.  I explained this to the patient and his wife they understood and agreed to proceed forward.  Operative procedure: Patient was brought into the OR was induced under general anesthesia positioned supine head turned slightly to the left with a shoulder bump under his right shoulder and the right posterior aspect of his frontal parietal lobe was shaved prepped and draped in routine sterile fashion then he was locked in pins and utilizing the Stealth stereotactic navigation system registration was obtained and confirmed I elected to draw a linear bucket-handle incision centering over the tumor.  We then incised this after infiltration of 10 cc lidocaine with epi and reflected the scalp both anteriorly and posteriorly.  Then again navigation confirmed adequate positioning of the bone flap I utilized the 4-hole  technique of drilling bur holes on either side of the superior sagittal sinus and then 1 on the right lower parietal lobe and then freed up the sinus from each bur hole and I turned and connected the inferolateral bur hole to the right sided parasagittal bur holes and then connected the left side parasagittal bur holes to each other and then my last cuts were across the sinus.  There was a moderate amount of oozing from the superior aspect the sinus this was all packed away with Gelfoam and was able to be controlled with packing during the case.  Then confirmed adequate craniotomy defect and opened up the dura from the inferolateral aspect and reflecting against the sinus the dura was markedly adherent to the meningioma so I resected a large part of the dura during the tumor resection.  However the plane along the capsule on the anterior lateral and posterior margin was easily developed medially identified what I felt was a vein or Trolard in the medial, posterior aspect and carefully dissected this off of the capsule of tumor.  So then working in a 306 degree orientation freed up the capsule of tumor and along the sinus I amputated the parasagittal, leaving a rim of tumor along the sinus wall.  And then working again progressively in a 3 and 6 degree orientation utilizing microdissection with Rhoton dissectors blunt dissection with saline soaked cottonoids freed up the margins of the tumor.  And I amputated the tumor and half sent that for frozen which was consistent with meningioma.  Then working around the anterior lateral and posterior aspects Trolard was coursing under the middle posterior and inferior part of the tumor and look like was dissecting right  through the middle of it so I then amputated the tumor and into pieces bisected it on either side of the vein and dissected the plane around the vein preserving Trolard during the dissection.  Then after the vast majority the tumor was out I inspected the  resection site there was a little bit of a knuckle of residual tumor anteriorly underneath the sinus I was able to resect a little more from the anterior compartment again I was leaving part of the tumor and capsule along the sinus tract because it was known to be invasion of the superior sagittal sinus which was not included.  However I was able to visualize the falx on the other side from the anterior and posterior aspect the resection bed and then the deep inferior aspect of resection bed cortical surface and peel surface of the brain look to be intact and Trolard appear to be intact as well.  So the wound was copiously irrigated meticulous hemostasis was maintained I resected a large amount of dura so I took a DuraGen patch anchor that into the dural leaflet along the sinus lateral aspect of it and then put Gelfoam overlying the sinus and reattach the craniotomy flap with bur hole covers and dog bone.  Then I placed a JP drain subgaleal and closed the galea with interrupted 2-0 Vicryl's to close the skin with a running locking nylon then the head was dressed patient was then remitted to recovery in stable condition.  At the end the case all needle count sponge counts were correct.

## 2023-04-19 ENCOUNTER — Encounter (HOSPITAL_COMMUNITY): Payer: Self-pay | Admitting: Neurosurgery

## 2023-04-19 LAB — BASIC METABOLIC PANEL
Anion gap: 8 (ref 5–15)
BUN: 11 mg/dL (ref 6–20)
CO2: 26 mmol/L (ref 22–32)
Calcium: 8.4 mg/dL — ABNORMAL LOW (ref 8.9–10.3)
Chloride: 104 mmol/L (ref 98–111)
Creatinine, Ser: 0.83 mg/dL (ref 0.61–1.24)
GFR, Estimated: >60 mL/min (ref >60)
Glucose, Bld: 129 mg/dL — ABNORMAL HIGH (ref 70–99)
Potassium: 4.1 mmol/L (ref 3.5–5.1)
Sodium: 138 mmol/L (ref 135–145)

## 2023-04-19 MED ORDER — GADOBUTROL 1 MMOL/ML IV SOLN
7.5000 mL | Freq: Once | INTRAVENOUS | Status: AC | PRN
  Administered 2023-04-19: 7.5 mL via INTRAVENOUS

## 2023-04-19 MED FILL — Thrombin For Soln 20000 Unit: CUTANEOUS | Qty: 1 | Status: AC

## 2023-04-19 NOTE — TOC CM/SW Note (Signed)
Transition of Care [facility]) - Inpatient Brief Assessment   Patient Details  Name: Glenn Hoffman MRN: [id number] Date of Birth: July 10, 1991  Transition of Care [facility] CM/SW Contact:    Mearl Latin, LCSW Phone Number: 04/19/2023, 10:11 AM   Clinical Narrative: Patient admitted from home with spouse s/p craniotomy. No current TOC needs identified at this time. Please place consult if needs arise.    Transition of Care Asessment: Insurance and Status: Insurance coverage has been reviewed Patient has primary care physician: Yes Home environment has been reviewed: From home Prior level of function:: Independent Prior/Current Home Services: No current home services Social Determinants of Health Reivew: SDOH reviewed no interventions necessary Readmission risk has been reviewed: Yes Transition of care needs: no transition of care needs at this time

## 2023-04-19 NOTE — Progress Notes (Signed)
Subjective: Patient reports minimal headache no other complaints  Objective: Vital signs in last 24 hours: Temp:  [97.2 F (36.2 C)-98.4 F (36.9 C)] 98.2 F (36.8 C) (09/12 0753) Pulse Rate:  [44-93] 57 (09/12 0700) Resp:  [10-16] 12 (09/12 0700) BP: (117-140)/(68-90) 120/78 (09/12 0000) SpO2:  [93 %-100 %] 100 % (09/12 0700) Arterial Line BP: (79-161)/(63-92) 147/67 (09/12 0700)  Intake/Output from previous day: 09/11 0701 - 09/12 0700 In: 3816.6 [I.V.:2966.6; IV Piggyback:850] Out: 4830 [Urine:3845; Drains:285; Blood:700] Intake/Output this shift: Total I/O In: -  Out: 30 [Drains:30]  Awake alert oriented neurologically intact  Lab Results: Recent Labs    04/18/23 1225 04/18/23 1600  WBC  --  28.0*  HGB 11.9* 11.2*  HCT 35.0* 32.3*  PLT  --  210   BMET Recent Labs    04/18/23 1600 04/19/23 0532  NA 139 138  K 4.1 4.1  CL 109 104  CO2 22 26  GLUCOSE 133* 129*  BUN 13 11  CREATININE 1.05 0.83  CALCIUM 7.5* 8.4*    Studies/Results: MR BRAIN W WO CONTRAST  Result Date: 04/19/2023 CLINICAL DATA:  Brain neoplasm staging EXAM: MRI HEAD WITHOUT AND WITH CONTRAST TECHNIQUE: Multiplanar, multiecho pulse sequences of the brain and surrounding structures were obtained without and with intravenous contrast. CONTRAST:  7.28mL GADAVIST GADOBUTROL 1 MMOL/ML IV SOLN COMPARISON:  03/20/2023 FINDINGS: Brain: Status post resection of posterior right convexity meningioma. There is expected blood at the resection site and small volume pneumocephalus. The majority of the mass has been resected. At the posterior aspect of the superior sagittal sinus, there is a residual component of mass that measures 2.8 x 1.8 x 2.0 cm there is a small dural tail that extends to the right. Otherwise, no residual tumor enhancement. There is no acute ischemia. No midline shift or other mass effect. No hydrocephalus. Vascular: Normal arterial flow voids Skull and upper cervical spine: Status post  posterior right craniotomy. Sinuses/Orbits: Paranasal sinuses are clear. No mastoid effusion. Normal orbits. Other: None IMPRESSION: 1. Status post resection of posterior right convexity meningioma with expected blood products at the resection site and small volume pneumocephalus. 2. Residual tumor at the posterior aspect of the superior sagittal sinus measuring 2.8 x 1.8 x 2.0 cm. Electronically Signed   By: Deatra Robinson M.D.   On: 04/19/2023 02:25   IR Transcath/Emboliz  Result Date: 04/17/2023 PROCEDURE: ONYX EMBOLIZATION OF RIGHT MIDDLE MENINGEAL ARTERY HISTORY: The patient is a 32 year old man a large right parietal meningioma. The patient was seen in the Outpatient neurosurgery clinic where surgical resection of the tumor was recommended. Preoperative middle meningeal artery embolization was requested. ACCESS: The technical aspects of the procedure as well as its potential risks and benefits were reviewed with the patient and family. These risks included but were not limited to stroke, intracranial hemorrhage, bleeding, infection, allergic reaction, damage to organs or vital structures, stroke, non-diagnostic procedure, and the catastrophic outcomes of heart attack, coma, and death. With an understanding of these risks, informed consent was obtained and witnessed. The patient was placed in the supine position on the angiography table and the skin of right groin prepped in the usual sterile fashion. The procedure was performed under general anesthesia. A 5-French sheath was introduced in the right common femoral artery using Seldinger technique. MEDICATIONS: HEPARIN: 2000 Units total. CONTRAST:  42mL OMNIPAQUE IOHEXOL 300 MG/ML  SOLNcc, Omnipaque 300 FLUOROSCOPY TIME:  FLUOROSCOPY TIME: See IR records TECHNIQUE: CATHETERS AND WIRES 5-French Envoy guide catheter 180 cm  0.035" glidewire Apollo microcatheter Synchro 10 microwire EMBOLIC AGENT USED Onyx 18 VESSELS CATHETERIZED Right internal carotid artery  Right external carotid artery Right middle meningeal artery Right common femoral VESSELS STUDIED Right internal carotid artery Right external carotid artery Right middle meningeal artery, microcatheter run Right external carotid artery, post-embolization Right common femoral PROCEDURAL NARRATIVE The guide catheter was introduced over the Glidewire and the right external carotid artery was selected. Angiogram was taken. The right internal carotid artery was then selected and angiogram was taken. After review of the images, I elected to proceed with the embolization. Under roadmap guidance, the guide catheter was again positioned in the right external carotid artery. The microcatheter was introduced over the microwire and the right middle meningeal artery was selected. Microcatheter was then taken to confirm no perfusion to the underlying brain. The catheter was then flushed with DMSO, and under standard roadmap and blank roadmap technique, the middle meningeal artery was embolized. The microcatheter was then removed without incident. Post embolization angiogram was taken through the guide catheter. A second more posteriorly directed branch of the middle meningeal artery was also noted supplying the tumor. Multiple attempts were made to catheterize this vessel with a second Apollo microcatheter over the synchro wire. Unfortunately due to tortuosity at the origin of this more posteriorly directed vessel I was unable to catheterize this branch. I therefore removed the Apollo microcatheter and micro. After review of the images, the guide catheter was removed without incident. FINDINGS: Right internal carotid, head: Injection reveals the presence of a widely patent ICA, M1, and A1 segments and their branches. Of note, the ophthalmic artery has normal origin from the internal carotid. No aneurysms, AVMs, or high-flow fistulas are seen. Interestingly, there are small extracranial branches arising from the anterior surface  of the cervical internal carotid which appeared to supply soft tissues within the prevertebral space. The parenchymal and venous phases are normal. The venous sinuses are widely patent. Right external carotid head: Visualized cranial branches of the external carotid artery are unremarkable. The middle meningeal artery is identified arising from internal maxillary artery. No opacification of the pial veins or dural venous sinuses is seen to suggest any brain perfusion. Right middle meningeal artery, microcatheter run: Angiogram taken from the microcatheter within the middle meningeal artery does not reveal any perfusion of the underlying brain. There are 3 branches of the middle meningeal artery coursing posteriorly which supply the known parasagittal tumor. Right external carotid, post embolization: Angiogram taken from the guide catheter in the external carotid artery reveals onyx cast within the 2 anterior branches of the middle meningeal artery. A single more posteriorly directed branch remains patent and does provide some supply to the posterior aspect of the meningioma. Remainder of the cranial branches of the external carotid artery are unremarkable. Right femoral: Normal vessel. No significant atherosclerotic disease. Arterial sheath in adequate position. DISPOSITION: Upon completion of the study, the femoral sheath was removed and hemostasis obtained using a 5-Fr ExoSeal closure device. Good proximal and distal lower extremity pulses were documented upon achievement of hemostasis. The procedure was well tolerated and no early complications were observed. The patient was transferred to the postanesthesia care unit in stable hemodynamic condition. IMPRESSION: 1. Successful onyx embolization of the right middle meningeal artery for meningioma as a preoperative adjunct. The preliminary results of this procedure were shared with the patient's family. Electronically Signed   By: Lisbeth Renshaw   On: 04/17/2023  13:54   IR ANGIO EXTERNAL CAROTID SEL EXT CAROTID  UNI R MOD SED  Result Date: 04/17/2023 PROCEDURE: ONYX EMBOLIZATION OF RIGHT MIDDLE MENINGEAL ARTERY HISTORY: The patient is a 32 year old man a large right parietal meningioma. The patient was seen in the Outpatient neurosurgery clinic where surgical resection of the tumor was recommended. Preoperative middle meningeal artery embolization was requested. ACCESS: The technical aspects of the procedure as well as its potential risks and benefits were reviewed with the patient and family. These risks included but were not limited to stroke, intracranial hemorrhage, bleeding, infection, allergic reaction, damage to organs or vital structures, stroke, non-diagnostic procedure, and the catastrophic outcomes of heart attack, coma, and death. With an understanding of these risks, informed consent was obtained and witnessed. The patient was placed in the supine position on the angiography table and the skin of right groin prepped in the usual sterile fashion. The procedure was performed under general anesthesia. A 5-French sheath was introduced in the right common femoral artery using Seldinger technique. MEDICATIONS: HEPARIN: 2000 Units total. CONTRAST:  42mL OMNIPAQUE IOHEXOL 300 MG/ML  SOLNcc, Omnipaque 300 FLUOROSCOPY TIME:  FLUOROSCOPY TIME: See IR records TECHNIQUE: CATHETERS AND WIRES 5-French Envoy guide catheter 180 cm 0.035" glidewire Apollo microcatheter Synchro 10 microwire EMBOLIC AGENT USED Onyx 18 VESSELS CATHETERIZED Right internal carotid artery Right external carotid artery Right middle meningeal artery Right common femoral VESSELS STUDIED Right internal carotid artery Right external carotid artery Right middle meningeal artery, microcatheter run Right external carotid artery, post-embolization Right common femoral PROCEDURAL NARRATIVE The guide catheter was introduced over the Glidewire and the right external carotid artery was selected. Angiogram was  taken. The right internal carotid artery was then selected and angiogram was taken. After review of the images, I elected to proceed with the embolization. Under roadmap guidance, the guide catheter was again positioned in the right external carotid artery. The microcatheter was introduced over the microwire and the right middle meningeal artery was selected. Microcatheter was then taken to confirm no perfusion to the underlying brain. The catheter was then flushed with DMSO, and under standard roadmap and blank roadmap technique, the middle meningeal artery was embolized. The microcatheter was then removed without incident. Post embolization angiogram was taken through the guide catheter. A second more posteriorly directed branch of the middle meningeal artery was also noted supplying the tumor. Multiple attempts were made to catheterize this vessel with a second Apollo microcatheter over the synchro wire. Unfortunately due to tortuosity at the origin of this more posteriorly directed vessel I was unable to catheterize this branch. I therefore removed the Apollo microcatheter and micro. After review of the images, the guide catheter was removed without incident. FINDINGS: Right internal carotid, head: Injection reveals the presence of a widely patent ICA, M1, and A1 segments and their branches. Of note, the ophthalmic artery has normal origin from the internal carotid. No aneurysms, AVMs, or high-flow fistulas are seen. Interestingly, there are small extracranial branches arising from the anterior surface of the cervical internal carotid which appeared to supply soft tissues within the prevertebral space. The parenchymal and venous phases are normal. The venous sinuses are widely patent. Right external carotid head: Visualized cranial branches of the external carotid artery are unremarkable. The middle meningeal artery is identified arising from internal maxillary artery. No opacification of the pial veins or dural  venous sinuses is seen to suggest any brain perfusion. Right middle meningeal artery, microcatheter run: Angiogram taken from the microcatheter within the middle meningeal artery does not reveal any perfusion of the underlying brain. There  are 3 branches of the middle meningeal artery coursing posteriorly which supply the known parasagittal tumor. Right external carotid, post embolization: Angiogram taken from the guide catheter in the external carotid artery reveals onyx cast within the 2 anterior branches of the middle meningeal artery. A single more posteriorly directed branch remains patent and does provide some supply to the posterior aspect of the meningioma. Remainder of the cranial branches of the external carotid artery are unremarkable. Right femoral: Normal vessel. No significant atherosclerotic disease. Arterial sheath in adequate position. DISPOSITION: Upon completion of the study, the femoral sheath was removed and hemostasis obtained using a 5-Fr ExoSeal closure device. Good proximal and distal lower extremity pulses were documented upon achievement of hemostasis. The procedure was well tolerated and no early complications were observed. The patient was transferred to the postanesthesia care unit in stable hemodynamic condition. IMPRESSION: 1. Successful onyx embolization of the right middle meningeal artery for meningioma as a preoperative adjunct. The preliminary results of this procedure were shared with the patient's family. Electronically Signed   By: Lisbeth Renshaw   On: 04/17/2023 13:54   IR ANGIO INTRA EXTRACRAN SEL INTERNAL CAROTID UNI R MOD SED  Result Date: 04/17/2023 PROCEDURE: ONYX EMBOLIZATION OF RIGHT MIDDLE MENINGEAL ARTERY HISTORY: The patient is a 32 year old man a large right parietal meningioma. The patient was seen in the Outpatient neurosurgery clinic where surgical resection of the tumor was recommended. Preoperative middle meningeal artery embolization was requested.  ACCESS: The technical aspects of the procedure as well as its potential risks and benefits were reviewed with the patient and family. These risks included but were not limited to stroke, intracranial hemorrhage, bleeding, infection, allergic reaction, damage to organs or vital structures, stroke, non-diagnostic procedure, and the catastrophic outcomes of heart attack, coma, and death. With an understanding of these risks, informed consent was obtained and witnessed. The patient was placed in the supine position on the angiography table and the skin of right groin prepped in the usual sterile fashion. The procedure was performed under general anesthesia. A 5-French sheath was introduced in the right common femoral artery using Seldinger technique. MEDICATIONS: HEPARIN: 2000 Units total. CONTRAST:  42mL OMNIPAQUE IOHEXOL 300 MG/ML  SOLNcc, Omnipaque 300 FLUOROSCOPY TIME:  FLUOROSCOPY TIME: See IR records TECHNIQUE: CATHETERS AND WIRES 5-French Envoy guide catheter 180 cm 0.035" glidewire Apollo microcatheter Synchro 10 microwire EMBOLIC AGENT USED Onyx 18 VESSELS CATHETERIZED Right internal carotid artery Right external carotid artery Right middle meningeal artery Right common femoral VESSELS STUDIED Right internal carotid artery Right external carotid artery Right middle meningeal artery, microcatheter run Right external carotid artery, post-embolization Right common femoral PROCEDURAL NARRATIVE The guide catheter was introduced over the Glidewire and the right external carotid artery was selected. Angiogram was taken. The right internal carotid artery was then selected and angiogram was taken. After review of the images, I elected to proceed with the embolization. Under roadmap guidance, the guide catheter was again positioned in the right external carotid artery. The microcatheter was introduced over the microwire and the right middle meningeal artery was selected. Microcatheter was then taken to confirm no  perfusion to the underlying brain. The catheter was then flushed with DMSO, and under standard roadmap and blank roadmap technique, the middle meningeal artery was embolized. The microcatheter was then removed without incident. Post embolization angiogram was taken through the guide catheter. A second more posteriorly directed branch of the middle meningeal artery was also noted supplying the tumor. Multiple attempts were made to catheterize this  vessel with a second Apollo microcatheter over the synchro wire. Unfortunately due to tortuosity at the origin of this more posteriorly directed vessel I was unable to catheterize this branch. I therefore removed the Apollo microcatheter and micro. After review of the images, the guide catheter was removed without incident. FINDINGS: Right internal carotid, head: Injection reveals the presence of a widely patent ICA, M1, and A1 segments and their branches. Of note, the ophthalmic artery has normal origin from the internal carotid. No aneurysms, AVMs, or high-flow fistulas are seen. Interestingly, there are small extracranial branches arising from the anterior surface of the cervical internal carotid which appeared to supply soft tissues within the prevertebral space. The parenchymal and venous phases are normal. The venous sinuses are widely patent. Right external carotid head: Visualized cranial branches of the external carotid artery are unremarkable. The middle meningeal artery is identified arising from internal maxillary artery. No opacification of the pial veins or dural venous sinuses is seen to suggest any brain perfusion. Right middle meningeal artery, microcatheter run: Angiogram taken from the microcatheter within the middle meningeal artery does not reveal any perfusion of the underlying brain. There are 3 branches of the middle meningeal artery coursing posteriorly which supply the known parasagittal tumor. Right external carotid, post embolization: Angiogram  taken from the guide catheter in the external carotid artery reveals onyx cast within the 2 anterior branches of the middle meningeal artery. A single more posteriorly directed branch remains patent and does provide some supply to the posterior aspect of the meningioma. Remainder of the cranial branches of the external carotid artery are unremarkable. Right femoral: Normal vessel. No significant atherosclerotic disease. Arterial sheath in adequate position. DISPOSITION: Upon completion of the study, the femoral sheath was removed and hemostasis obtained using a 5-Fr ExoSeal closure device. Good proximal and distal lower extremity pulses were documented upon achievement of hemostasis. The procedure was well tolerated and no early complications were observed. The patient was transferred to the postanesthesia care unit in stable hemodynamic condition. IMPRESSION: 1. Successful onyx embolization of the right middle meningeal artery for meningioma as a preoperative adjunct. The preliminary results of this procedure were shared with the patient's family. Electronically Signed   By: Lisbeth Renshaw   On: 04/17/2023 13:54   IR Angiogram Follow Up Study  Result Date: 04/17/2023 PROCEDURE: ONYX EMBOLIZATION OF RIGHT MIDDLE MENINGEAL ARTERY HISTORY: The patient is a 32 year old man a large right parietal meningioma. The patient was seen in the Outpatient neurosurgery clinic where surgical resection of the tumor was recommended. Preoperative middle meningeal artery embolization was requested. ACCESS: The technical aspects of the procedure as well as its potential risks and benefits were reviewed with the patient and family. These risks included but were not limited to stroke, intracranial hemorrhage, bleeding, infection, allergic reaction, damage to organs or vital structures, stroke, non-diagnostic procedure, and the catastrophic outcomes of heart attack, coma, and death. With an understanding of these risks, informed  consent was obtained and witnessed. The patient was placed in the supine position on the angiography table and the skin of right groin prepped in the usual sterile fashion. The procedure was performed under general anesthesia. A 5-French sheath was introduced in the right common femoral artery using Seldinger technique. MEDICATIONS: HEPARIN: 2000 Units total. CONTRAST:  42mL OMNIPAQUE IOHEXOL 300 MG/ML  SOLNcc, Omnipaque 300 FLUOROSCOPY TIME:  FLUOROSCOPY TIME: See IR records TECHNIQUE: CATHETERS AND WIRES 5-French Envoy guide catheter 180 cm 0.035" glidewire Apollo microcatheter Synchro 10 microwire EMBOLIC AGENT  USED Onyx 18 VESSELS CATHETERIZED Right internal carotid artery Right external carotid artery Right middle meningeal artery Right common femoral VESSELS STUDIED Right internal carotid artery Right external carotid artery Right middle meningeal artery, microcatheter run Right external carotid artery, post-embolization Right common femoral PROCEDURAL NARRATIVE The guide catheter was introduced over the Glidewire and the right external carotid artery was selected. Angiogram was taken. The right internal carotid artery was then selected and angiogram was taken. After review of the images, I elected to proceed with the embolization. Under roadmap guidance, the guide catheter was again positioned in the right external carotid artery. The microcatheter was introduced over the microwire and the right middle meningeal artery was selected. Microcatheter was then taken to confirm no perfusion to the underlying brain. The catheter was then flushed with DMSO, and under standard roadmap and blank roadmap technique, the middle meningeal artery was embolized. The microcatheter was then removed without incident. Post embolization angiogram was taken through the guide catheter. A second more posteriorly directed branch of the middle meningeal artery was also noted supplying the tumor. Multiple attempts were made to  catheterize this vessel with a second Apollo microcatheter over the synchro wire. Unfortunately due to tortuosity at the origin of this more posteriorly directed vessel I was unable to catheterize this branch. I therefore removed the Apollo microcatheter and micro. After review of the images, the guide catheter was removed without incident. FINDINGS: Right internal carotid, head: Injection reveals the presence of a widely patent ICA, M1, and A1 segments and their branches. Of note, the ophthalmic artery has normal origin from the internal carotid. No aneurysms, AVMs, or high-flow fistulas are seen. Interestingly, there are small extracranial branches arising from the anterior surface of the cervical internal carotid which appeared to supply soft tissues within the prevertebral space. The parenchymal and venous phases are normal. The venous sinuses are widely patent. Right external carotid head: Visualized cranial branches of the external carotid artery are unremarkable. The middle meningeal artery is identified arising from internal maxillary artery. No opacification of the pial veins or dural venous sinuses is seen to suggest any brain perfusion. Right middle meningeal artery, microcatheter run: Angiogram taken from the microcatheter within the middle meningeal artery does not reveal any perfusion of the underlying brain. There are 3 branches of the middle meningeal artery coursing posteriorly which supply the known parasagittal tumor. Right external carotid, post embolization: Angiogram taken from the guide catheter in the external carotid artery reveals onyx cast within the 2 anterior branches of the middle meningeal artery. A single more posteriorly directed branch remains patent and does provide some supply to the posterior aspect of the meningioma. Remainder of the cranial branches of the external carotid artery are unremarkable. Right femoral: Normal vessel. No significant atherosclerotic disease. Arterial  sheath in adequate position. DISPOSITION: Upon completion of the study, the femoral sheath was removed and hemostasis obtained using a 5-Fr ExoSeal closure device. Good proximal and distal lower extremity pulses were documented upon achievement of hemostasis. The procedure was well tolerated and no early complications were observed. The patient was transferred to the postanesthesia care unit in stable hemodynamic condition. IMPRESSION: 1. Successful onyx embolization of the right middle meningeal artery for meningioma as a preoperative adjunct. The preliminary results of this procedure were shared with the patient's family. Electronically Signed   By: Lisbeth Renshaw   On: 04/17/2023 13:54   IR NEURO EACH ADD'L AFTER BASIC UNI RIGHT (MS)  Result Date: 04/17/2023 PROCEDURE: ONYX EMBOLIZATION OF  RIGHT MIDDLE MENINGEAL ARTERY HISTORY: The patient is a 32 year old man a large right parietal meningioma. The patient was seen in the Outpatient neurosurgery clinic where surgical resection of the tumor was recommended. Preoperative middle meningeal artery embolization was requested. ACCESS: The technical aspects of the procedure as well as its potential risks and benefits were reviewed with the patient and family. These risks included but were not limited to stroke, intracranial hemorrhage, bleeding, infection, allergic reaction, damage to organs or vital structures, stroke, non-diagnostic procedure, and the catastrophic outcomes of heart attack, coma, and death. With an understanding of these risks, informed consent was obtained and witnessed. The patient was placed in the supine position on the angiography table and the skin of right groin prepped in the usual sterile fashion. The procedure was performed under general anesthesia. A 5-French sheath was introduced in the right common femoral artery using Seldinger technique. MEDICATIONS: HEPARIN: 2000 Units total. CONTRAST:  42mL OMNIPAQUE IOHEXOL 300 MG/ML  SOLNcc,  Omnipaque 300 FLUOROSCOPY TIME:  FLUOROSCOPY TIME: See IR records TECHNIQUE: CATHETERS AND WIRES 5-French Envoy guide catheter 180 cm 0.035" glidewire Apollo microcatheter Synchro 10 microwire EMBOLIC AGENT USED Onyx 18 VESSELS CATHETERIZED Right internal carotid artery Right external carotid artery Right middle meningeal artery Right common femoral VESSELS STUDIED Right internal carotid artery Right external carotid artery Right middle meningeal artery, microcatheter run Right external carotid artery, post-embolization Right common femoral PROCEDURAL NARRATIVE The guide catheter was introduced over the Glidewire and the right external carotid artery was selected. Angiogram was taken. The right internal carotid artery was then selected and angiogram was taken. After review of the images, I elected to proceed with the embolization. Under roadmap guidance, the guide catheter was again positioned in the right external carotid artery. The microcatheter was introduced over the microwire and the right middle meningeal artery was selected. Microcatheter was then taken to confirm no perfusion to the underlying brain. The catheter was then flushed with DMSO, and under standard roadmap and blank roadmap technique, the middle meningeal artery was embolized. The microcatheter was then removed without incident. Post embolization angiogram was taken through the guide catheter. A second more posteriorly directed branch of the middle meningeal artery was also noted supplying the tumor. Multiple attempts were made to catheterize this vessel with a second Apollo microcatheter over the synchro wire. Unfortunately due to tortuosity at the origin of this more posteriorly directed vessel I was unable to catheterize this branch. I therefore removed the Apollo microcatheter and micro. After review of the images, the guide catheter was removed without incident. FINDINGS: Right internal carotid, head: Injection reveals the presence of a  widely patent ICA, M1, and A1 segments and their branches. Of note, the ophthalmic artery has normal origin from the internal carotid. No aneurysms, AVMs, or high-flow fistulas are seen. Interestingly, there are small extracranial branches arising from the anterior surface of the cervical internal carotid which appeared to supply soft tissues within the prevertebral space. The parenchymal and venous phases are normal. The venous sinuses are widely patent. Right external carotid head: Visualized cranial branches of the external carotid artery are unremarkable. The middle meningeal artery is identified arising from internal maxillary artery. No opacification of the pial veins or dural venous sinuses is seen to suggest any brain perfusion. Right middle meningeal artery, microcatheter run: Angiogram taken from the microcatheter within the middle meningeal artery does not reveal any perfusion of the underlying brain. There are 3 branches of the middle meningeal artery coursing posteriorly which supply  the known parasagittal tumor. Right external carotid, post embolization: Angiogram taken from the guide catheter in the external carotid artery reveals onyx cast within the 2 anterior branches of the middle meningeal artery. A single more posteriorly directed branch remains patent and does provide some supply to the posterior aspect of the meningioma. Remainder of the cranial branches of the external carotid artery are unremarkable. Right femoral: Normal vessel. No significant atherosclerotic disease. Arterial sheath in adequate position. DISPOSITION: Upon completion of the study, the femoral sheath was removed and hemostasis obtained using a 5-Fr ExoSeal closure device. Good proximal and distal lower extremity pulses were documented upon achievement of hemostasis. The procedure was well tolerated and no early complications were observed. The patient was transferred to the postanesthesia care unit in stable hemodynamic  condition. IMPRESSION: 1. Successful onyx embolization of the right middle meningeal artery for meningioma as a preoperative adjunct. The preliminary results of this procedure were shared with the patient's family. 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  Continue to wean steroids will transfer to the floor continue JP drain for now.  LOS: 2 days     Mariam Dollar 04/19/2023, 8:11 AM

## 2023-04-20 MED ORDER — LEVETIRACETAM 500 MG PO TABS: 500.0000 mg | ORAL_TABLET | Freq: Two times a day (BID) | ORAL | Status: DC

## 2023-04-20 MED ORDER — PANTOPRAZOLE SODIUM 40 MG PO TBEC: 40.0000 mg | DELAYED_RELEASE_TABLET | Freq: Every day | ORAL | Status: DC

## 2023-04-20 MED ORDER — HYDROCODONE-ACETAMINOPHEN 5-325 MG PO TABS: 1.0000 | ORAL_TABLET | ORAL | 0 refills | Status: DC | PRN

## 2023-04-20 MED ORDER — METHYLPREDNISOLONE 4 MG PO TBPK: ORAL_TABLET | ORAL | 0 refills | Status: DC

## 2023-04-20 NOTE — Discharge Summary (Signed)
Physician Discharge Summary  Patient ID: Glenn Hoffman MRN: [id number] DOB/AGE: 10/07/1990 32 y.o.  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     Discharge Diagnoses: same   Discharged Condition: good  Hospital Course: The patient was admitted on 04/17/2023 and taken to the operating room where the patient underwent crani for resection of meningioma. The patient tolerated the procedure well and was taken to the recovery room and then to the ICU in stable condition. The hospital course was routine. There were no complications. The wound remained clean dry and intact. Pt had appropriate head soreness. No complaints of new pain or new N/T/W. The patient remained afebrile with stable vital signs, and tolerated a regular diet. The patient continued to increase activities, and pain was well controlled with oral pain medications.   Consults: None  Significant Diagnostic Studies:  Results for orders placed or performed during the hospital encounter of 04/17/23  MRSA Next Gen by PCR, Nasal   Specimen: Nasal Mucosa; Nasal Swab  Result Value Ref Range   MRSA by PCR Next Gen NOT DETECTED NOT DETECTED  Basic metabolic panel  Result Value Ref Range   Sodium 139 135 - 145 mmol/L   Potassium 4.1 3.5 - 5.1 mmol/L   Chloride 109 98 - 111 mmol/L   CO2 22 22 - 32 mmol/L   Glucose, Bld 133 (H) 70 - 99 mg/dL   BUN 13 6 - 20 mg/dL   Creatinine, Ser 4.09 0.61 - 1.24 mg/dL   Calcium 7.5 (L) 8.9 - 10.3 mg/dL   GFR, Estimated >81 >19 mL/min   Anion gap 8 5 - 15  CBC  Result Value Ref Range   WBC 28.0 (H) 4.0 - 10.5 K/uL   RBC 3.69 (L) 4.22 - 5.81 MIL/uL   Hemoglobin 11.2 (L) 13.0 - 17.0 g/dL   HCT 14.7 (L) 82.9 - 56.2 %   MCV 87.5 80.0 - 100.0 fL   MCH 30.4 26.0 - 34.0 pg   MCHC 34.7 30.0 - 36.0 g/dL   RDW 13.0 86.5 - 78.4 %   Platelets 210 150 - 400 K/uL   nRBC 0.0 0.0 - 0.2 %  Basic metabolic panel  Result Value Ref  Range   Sodium 138 135 - 145 mmol/L   Potassium 4.1 3.5 - 5.1 mmol/L   Chloride 104 98 - 111 mmol/L   CO2 26 22 - 32 mmol/L   Glucose, Bld 129 (H) 70 - 99 mg/dL   BUN 11 6 - 20 mg/dL   Creatinine, Ser 6.96 0.61 - 1.24 mg/dL   Calcium 8.4 (L) 8.9 - 10.3 mg/dL   GFR, Estimated >29 >52 mL/min   Anion gap 8 5 - 15  I-STAT 7, (LYTES, BLD GAS, ICA, H+H)  Result Value Ref Range   pH, Arterial 7.438 7.35 - 7.45   pCO2 arterial 32.6 32 - 48 mmHg   pO2, Arterial 301 (H) 83 - 108 mmHg   Bicarbonate 22.0 20.0 - 28.0 mmol/L   TCO2 23 22 - 32 mmol/L   O2 Saturation 100 %   Acid-base deficit 1.0 0.0 - 2.0 mmol/L   Sodium 139 135 - 145 mmol/L   Potassium 3.7 3.5 - 5.1 mmol/L   Calcium, Ion 1.12 (L) 1.15 - 1.40 mmol/L   HCT 37.0 (L) 39.0 - 52.0 %   Hemoglobin 12.6 (L) 13.0 - 17.0 g/dL   Sample type ARTERIAL   I-STAT 7, (LYTES, BLD GAS, ICA,  H+H)  Result Value Ref Range   pH, Arterial 7.440 7.35 - 7.45   pCO2 arterial 34.5 32 - 48 mmHg   pO2, Arterial 287 (H) 83 - 108 mmHg   Bicarbonate 23.4 20.0 - 28.0 mmol/L   TCO2 24 22 - 32 mmol/L   O2 Saturation 100 %   Acid-Base Excess 0.0 0.0 - 2.0 mmol/L   Sodium 139 135 - 145 mmol/L   Potassium 3.9 3.5 - 5.1 mmol/L   Calcium, Ion 1.15 1.15 - 1.40 mmol/L   HCT 38.0 (L) 39.0 - 52.0 %   Hemoglobin 12.9 (L) 13.0 - 17.0 g/dL   Sample type ARTERIAL   I-STAT 7, (LYTES, BLD GAS, ICA, H+H)  Result Value Ref Range   pH, Arterial 7.487 (H) 7.35 - 7.45   pCO2 arterial 29.4 (L) 32 - 48 mmHg   pO2, Arterial 281 (H) 83 - 108 mmHg   Bicarbonate 22.3 20.0 - 28.0 mmol/L   TCO2 23 22 - 32 mmol/L   O2 Saturation 100 %   Acid-Base Excess 0.0 0.0 - 2.0 mmol/L   Sodium 140 135 - 145 mmol/L   Potassium 3.8 3.5 - 5.1 mmol/L   Calcium, Ion 1.09 (L) 1.15 - 1.40 mmol/L   HCT 35.0 (L) 39.0 - 52.0 %   Hemoglobin 11.9 (L) 13.0 - 17.0 g/dL   Patient temperature 25.3 C    Sample type ARTERIAL   ABO/Rh  Result Value Ref Range   ABO/RH(D)      A POS Performed at  [facility], [address], [place], [place] [address]   Prepare RBC (crossmatch)  Result Value Ref Range   Order Confirmation      ORDER PROCESSED BY BLOOD BANK Performed at [facility], [address] [place], [place] [address]     MR BRAIN W WO CONTRAST  Result Date: 04/19/2023 CLINICAL DATA:  Brain neoplasm staging EXAM: MRI HEAD WITHOUT AND WITH CONTRAST TECHNIQUE: Multiplanar, multiecho pulse sequences of the brain and surrounding structures were obtained without and with intravenous contrast. CONTRAST:  7.46mL GADAVIST GADOBUTROL 1 MMOL/ML IV SOLN COMPARISON:  03/20/2023 FINDINGS: Brain: Status post resection of posterior right convexity meningioma. There is expected blood at the resection site and small volume pneumocephalus. The majority of the mass has been resected. At the posterior aspect of the superior sagittal sinus, there is a residual component of mass that measures 2.8 x 1.8 x 2.0 cm there is a small dural tail that extends to the right. Otherwise, no residual tumor enhancement. There is no acute ischemia. No midline shift or other mass effect. No hydrocephalus. Vascular: Normal arterial flow voids Skull and upper cervical spine: Status post posterior right craniotomy. Sinuses/Orbits: Paranasal sinuses are clear. No mastoid effusion. Normal orbits. Other: None IMPRESSION: 1. Status post resection of posterior right convexity meningioma with expected blood products at the resection site and small volume pneumocephalus. 2. Residual tumor at the posterior aspect of the superior sagittal sinus measuring 2.8 x 1.8 x 2.0 cm. Electronically Signed   By: Deatra Robinson M.D.   On: 04/19/2023 02:25   IR Transcath/Emboliz  Result Date: 04/17/2023 PROCEDURE: ONYX EMBOLIZATION OF RIGHT MIDDLE MENINGEAL ARTERY HISTORY: The patient is a 32 year old man a large right parietal meningioma. The patient was seen in the Outpatient neurosurgery clinic where surgical resection of the tumor  was recommended. Preoperative middle meningeal artery embolization was requested. ACCESS: The technical aspects of the procedure as well as its potential risks and  benefits were reviewed with the patient and family. These risks included but were not limited to stroke, intracranial hemorrhage, bleeding, infection, allergic reaction, damage to organs or vital structures, stroke, non-diagnostic procedure, and the catastrophic outcomes of heart attack, coma, and death. With an understanding of these risks, informed consent was obtained and witnessed. The patient was placed in the supine position on the angiography table and the skin of right groin prepped in the usual sterile fashion. The procedure was performed under general anesthesia. A 5-French sheath was introduced in the right common femoral artery using Seldinger technique. MEDICATIONS: HEPARIN: 2000 Units total. CONTRAST:  42mL OMNIPAQUE IOHEXOL 300 MG/ML  SOLNcc, Omnipaque 300 FLUOROSCOPY TIME:  FLUOROSCOPY TIME: See IR records TECHNIQUE: CATHETERS AND WIRES 5-French Envoy guide catheter 180 cm 0.035" glidewire Apollo microcatheter Synchro 10 microwire EMBOLIC AGENT USED Onyx 18 VESSELS CATHETERIZED Right internal carotid artery Right external carotid artery Right middle meningeal artery Right common femoral VESSELS STUDIED Right internal carotid artery Right external carotid artery Right middle meningeal artery, microcatheter run Right external carotid artery, post-embolization Right common femoral PROCEDURAL NARRATIVE The guide catheter was introduced over the Glidewire and the right external carotid artery was selected. Angiogram was taken. The right internal carotid artery was then selected and angiogram was taken. After review of the images, I elected to proceed with the embolization. Under roadmap guidance, the guide catheter was again positioned in the right external carotid artery. The microcatheter was introduced over the microwire and the right middle  meningeal artery was selected. Microcatheter was then taken to confirm no perfusion to the underlying brain. The catheter was then flushed with DMSO, and under standard roadmap and blank roadmap technique, the middle meningeal artery was embolized. The microcatheter was then removed without incident. Post embolization angiogram was taken through the guide catheter. A second more posteriorly directed branch of the middle meningeal artery was also noted supplying the tumor. Multiple attempts were made to catheterize this vessel with a second Apollo microcatheter over the synchro wire. Unfortunately due to tortuosity at the origin of this more posteriorly directed vessel I was unable to catheterize this branch. I therefore removed the Apollo microcatheter and micro. After review of the images, the guide catheter was removed without incident. FINDINGS: Right internal carotid, head: Injection reveals the presence of a widely patent ICA, M1, and A1 segments and their branches. Of note, the ophthalmic artery has normal origin from the internal carotid. No aneurysms, AVMs, or high-flow fistulas are seen. Interestingly, there are small extracranial branches arising from the anterior surface of the cervical internal carotid which appeared to supply soft tissues within the prevertebral space. The parenchymal and venous phases are normal. The venous sinuses are widely patent. Right external carotid head: Visualized cranial branches of the external carotid artery are unremarkable. The middle meningeal artery is identified arising from internal maxillary artery. No opacification of the pial veins or dural venous sinuses is seen to suggest any brain perfusion. Right middle meningeal artery, microcatheter run: Angiogram taken from the microcatheter within the middle meningeal artery does not reveal any perfusion of the underlying brain. There are 3 branches of the middle meningeal artery coursing posteriorly which supply the known  parasagittal tumor. Right external carotid, post embolization: Angiogram taken from the guide catheter in the external carotid artery reveals onyx cast within the 2 anterior branches of the middle meningeal artery. A single more posteriorly directed branch remains patent and does provide some supply to the posterior aspect of the meningioma. Remainder  of the cranial branches of the external carotid artery are unremarkable. Right femoral: Normal vessel. No significant atherosclerotic disease. Arterial sheath in adequate position. DISPOSITION: Upon completion of the study, the femoral sheath was removed and hemostasis obtained using a 5-Fr ExoSeal closure device. Good proximal and distal lower extremity pulses were documented upon achievement of hemostasis. The procedure was well tolerated and no early complications were observed. The patient was transferred to the postanesthesia care unit in stable hemodynamic condition. IMPRESSION: 1. Successful onyx embolization of the right middle meningeal artery for meningioma as a preoperative adjunct. The preliminary results of this procedure were shared with the patient's family. Electronically Signed   By: Lisbeth Renshaw   On: 04/17/2023 13:54   IR ANGIO EXTERNAL CAROTID SEL EXT CAROTID UNI R MOD SED  Result Date: 04/17/2023 PROCEDURE: ONYX EMBOLIZATION OF RIGHT MIDDLE MENINGEAL ARTERY HISTORY: The patient is a 32 year old man a large right parietal meningioma. The patient was seen in the Outpatient neurosurgery clinic where surgical resection of the tumor was recommended. Preoperative middle meningeal artery embolization was requested. ACCESS: The technical aspects of the procedure as well as its potential risks and benefits were reviewed with the patient and family. These risks included but were not limited to stroke, intracranial hemorrhage, bleeding, infection, allergic reaction, damage to organs or vital structures, stroke, non-diagnostic procedure, and the  catastrophic outcomes of heart attack, coma, and death. With an understanding of these risks, informed consent was obtained and witnessed. The patient was placed in the supine position on the angiography table and the skin of right groin prepped in the usual sterile fashion. The procedure was performed under general anesthesia. A 5-French sheath was introduced in the right common femoral artery using Seldinger technique. MEDICATIONS: HEPARIN: 2000 Units total. CONTRAST:  42mL OMNIPAQUE IOHEXOL 300 MG/ML  SOLNcc, Omnipaque 300 FLUOROSCOPY TIME:  FLUOROSCOPY TIME: See IR records TECHNIQUE: CATHETERS AND WIRES 5-French Envoy guide catheter 180 cm 0.035" glidewire Apollo microcatheter Synchro 10 microwire EMBOLIC AGENT USED Onyx 18 VESSELS CATHETERIZED Right internal carotid artery Right external carotid artery Right middle meningeal artery Right common femoral VESSELS STUDIED Right internal carotid artery Right external carotid artery Right middle meningeal artery, microcatheter run Right external carotid artery, post-embolization Right common femoral PROCEDURAL NARRATIVE The guide catheter was introduced over the Glidewire and the right external carotid artery was selected. Angiogram was taken. The right internal carotid artery was then selected and angiogram was taken. After review of the images, I elected to proceed with the embolization. Under roadmap guidance, the guide catheter was again positioned in the right external carotid artery. The microcatheter was introduced over the microwire and the right middle meningeal artery was selected. Microcatheter was then taken to confirm no perfusion to the underlying brain. The catheter was then flushed with DMSO, and under standard roadmap and blank roadmap technique, the middle meningeal artery was embolized. The microcatheter was then removed without incident. Post embolization angiogram was taken through the guide catheter. A second more posteriorly directed branch of  the middle meningeal artery was also noted supplying the tumor. Multiple attempts were made to catheterize this vessel with a second Apollo microcatheter over the synchro wire. Unfortunately due to tortuosity at the origin of this more posteriorly directed vessel I was unable to catheterize this branch. I therefore removed the Apollo microcatheter and micro. After review of the images, the guide catheter was removed without incident. FINDINGS: Right internal carotid, head: Injection reveals the presence of a widely patent ICA, M1, and  A1 segments and their branches. Of note, the ophthalmic artery has normal origin from the internal carotid. No aneurysms, AVMs, or high-flow fistulas are seen. Interestingly, there are small extracranial branches arising from the anterior surface of the cervical internal carotid which appeared to supply soft tissues within the prevertebral space. The parenchymal and venous phases are normal. The venous sinuses are widely patent. Right external carotid head: Visualized cranial branches of the external carotid artery are unremarkable. The middle meningeal artery is identified arising from internal maxillary artery. No opacification of the pial veins or dural venous sinuses is seen to suggest any brain perfusion. Right middle meningeal artery, microcatheter run: Angiogram taken from the microcatheter within the middle meningeal artery does not reveal any perfusion of the underlying brain. There are 3 branches of the middle meningeal artery coursing posteriorly which supply the known parasagittal tumor. Right external carotid, post embolization: Angiogram taken from the guide catheter in the external carotid artery reveals onyx cast within the 2 anterior branches of the middle meningeal artery. A single more posteriorly directed branch remains patent and does provide some supply to the posterior aspect of the meningioma. Remainder of the cranial branches of the external carotid artery are  unremarkable. Right femoral: Normal vessel. No significant atherosclerotic disease. Arterial sheath in adequate position. DISPOSITION: Upon completion of the study, the femoral sheath was removed and hemostasis obtained using a 5-Fr ExoSeal closure device. Good proximal and distal lower extremity pulses were documented upon achievement of hemostasis. The procedure was well tolerated and no early complications were observed. The patient was transferred to the postanesthesia care unit in stable hemodynamic condition. IMPRESSION: 1. Successful onyx embolization of the right middle meningeal artery for meningioma as a preoperative adjunct. The preliminary results of this procedure were shared with the patient's family. Electronically Signed   By: Lisbeth Renshaw   On: 04/17/2023 13:54   IR ANGIO INTRA EXTRACRAN SEL INTERNAL CAROTID UNI R MOD SED  Result Date: 04/17/2023 PROCEDURE: ONYX EMBOLIZATION OF RIGHT MIDDLE MENINGEAL ARTERY HISTORY: The patient is a 32 year old man a large right parietal meningioma. The patient was seen in the Outpatient neurosurgery clinic where surgical resection of the tumor was recommended. Preoperative middle meningeal artery embolization was requested. ACCESS: The technical aspects of the procedure as well as its potential risks and benefits were reviewed with the patient and family. These risks included but were not limited to stroke, intracranial hemorrhage, bleeding, infection, allergic reaction, damage to organs or vital structures, stroke, non-diagnostic procedure, and the catastrophic outcomes of heart attack, coma, and death. With an understanding of these risks, informed consent was obtained and witnessed. The patient was placed in the supine position on the angiography table and the skin of right groin prepped in the usual sterile fashion. The procedure was performed under general anesthesia. A 5-French sheath was introduced in the right common femoral artery using Seldinger  technique. MEDICATIONS: HEPARIN: 2000 Units total. CONTRAST:  42mL OMNIPAQUE IOHEXOL 300 MG/ML  SOLNcc, Omnipaque 300 FLUOROSCOPY TIME:  FLUOROSCOPY TIME: See IR records TECHNIQUE: CATHETERS AND WIRES 5-French Envoy guide catheter 180 cm 0.035" glidewire Apollo microcatheter Synchro 10 microwire EMBOLIC AGENT USED Onyx 18 VESSELS CATHETERIZED Right internal carotid artery Right external carotid artery Right middle meningeal artery Right common femoral VESSELS STUDIED Right internal carotid artery Right external carotid artery Right middle meningeal artery, microcatheter run Right external carotid artery, post-embolization Right common femoral PROCEDURAL NARRATIVE The guide catheter was introduced over the Glidewire and the right external carotid artery  was selected. Angiogram was taken. The right internal carotid artery was then selected and angiogram was taken. After review of the images, I elected to proceed with the embolization. Under roadmap guidance, the guide catheter was again positioned in the right external carotid artery. The microcatheter was introduced over the microwire and the right middle meningeal artery was selected. Microcatheter was then taken to confirm no perfusion to the underlying brain. The catheter was then flushed with DMSO, and under standard roadmap and blank roadmap technique, the middle meningeal artery was embolized. The microcatheter was then removed without incident. Post embolization angiogram was taken through the guide catheter. A second more posteriorly directed branch of the middle meningeal artery was also noted supplying the tumor. Multiple attempts were made to catheterize this vessel with a second Apollo microcatheter over the synchro wire. Unfortunately due to tortuosity at the origin of this more posteriorly directed vessel I was unable to catheterize this branch. I therefore removed the Apollo microcatheter and micro. After review of the images, the guide catheter was  removed without incident. FINDINGS: Right internal carotid, head: Injection reveals the presence of a widely patent ICA, M1, and A1 segments and their branches. Of note, the ophthalmic artery has normal origin from the internal carotid. No aneurysms, AVMs, or high-flow fistulas are seen. Interestingly, there are small extracranial branches arising from the anterior surface of the cervical internal carotid which appeared to supply soft tissues within the prevertebral space. The parenchymal and venous phases are normal. The venous sinuses are widely patent. Right external carotid head: Visualized cranial branches of the external carotid artery are unremarkable. The middle meningeal artery is identified arising from internal maxillary artery. No opacification of the pial veins or dural venous sinuses is seen to suggest any brain perfusion. Right middle meningeal artery, microcatheter run: Angiogram taken from the microcatheter within the middle meningeal artery does not reveal any perfusion of the underlying brain. There are 3 branches of the middle meningeal artery coursing posteriorly which supply the known parasagittal tumor. Right external carotid, post embolization: Angiogram taken from the guide catheter in the external carotid artery reveals onyx cast within the 2 anterior branches of the middle meningeal artery. A single more posteriorly directed branch remains patent and does provide some supply to the posterior aspect of the meningioma. Remainder of the cranial branches of the external carotid artery are unremarkable. Right femoral: Normal vessel. No significant atherosclerotic disease. Arterial sheath in adequate position. DISPOSITION: Upon completion of the study, the femoral sheath was removed and hemostasis obtained using a 5-Fr ExoSeal closure device. Good proximal and distal lower extremity pulses were documented upon achievement of hemostasis. The procedure was well tolerated and no early complications  were observed. The patient was transferred to the postanesthesia care unit in stable hemodynamic condition. IMPRESSION: 1. Successful onyx embolization of the right middle meningeal artery for meningioma as a preoperative adjunct. The preliminary results of this procedure were shared with the patient's family. Electronically Signed   By: Lisbeth Renshaw   On: 04/17/2023 13:54   IR Angiogram Follow Up Study  Result Date: 04/17/2023 PROCEDURE: ONYX EMBOLIZATION OF RIGHT MIDDLE MENINGEAL ARTERY HISTORY: The patient is a 32 year old man a large right parietal meningioma. The patient was seen in the Outpatient neurosurgery clinic where surgical resection of the tumor was recommended. Preoperative middle meningeal artery embolization was requested. ACCESS: The technical aspects of the procedure as well as its potential risks and benefits were reviewed with the patient and family. These risks  included but were not limited to stroke, intracranial hemorrhage, bleeding, infection, allergic reaction, damage to organs or vital structures, stroke, non-diagnostic procedure, and the catastrophic outcomes of heart attack, coma, and death. With an understanding of these risks, informed consent was obtained and witnessed. The patient was placed in the supine position on the angiography table and the skin of right groin prepped in the usual sterile fashion. The procedure was performed under general anesthesia. A 5-French sheath was introduced in the right common femoral artery using Seldinger technique. MEDICATIONS: HEPARIN: 2000 Units total. CONTRAST:  42mL OMNIPAQUE IOHEXOL 300 MG/ML  SOLNcc, Omnipaque 300 FLUOROSCOPY TIME:  FLUOROSCOPY TIME: See IR records TECHNIQUE: CATHETERS AND WIRES 5-French Envoy guide catheter 180 cm 0.035" glidewire Apollo microcatheter Synchro 10 microwire EMBOLIC AGENT USED Onyx 18 VESSELS CATHETERIZED Right internal carotid artery Right external carotid artery Right middle meningeal artery Right  common femoral VESSELS STUDIED Right internal carotid artery Right external carotid artery Right middle meningeal artery, microcatheter run Right external carotid artery, post-embolization Right common femoral PROCEDURAL NARRATIVE The guide catheter was introduced over the Glidewire and the right external carotid artery was selected. Angiogram was taken. The right internal carotid artery was then selected and angiogram was taken. After review of the images, I elected to proceed with the embolization. Under roadmap guidance, the guide catheter was again positioned in the right external carotid artery. The microcatheter was introduced over the microwire and the right middle meningeal artery was selected. Microcatheter was then taken to confirm no perfusion to the underlying brain. The catheter was then flushed with DMSO, and under standard roadmap and blank roadmap technique, the middle meningeal artery was embolized. The microcatheter was then removed without incident. Post embolization angiogram was taken through the guide catheter. A second more posteriorly directed branch of the middle meningeal artery was also noted supplying the tumor. Multiple attempts were made to catheterize this vessel with a second Apollo microcatheter over the synchro wire. Unfortunately due to tortuosity at the origin of this more posteriorly directed vessel I was unable to catheterize this branch. I therefore removed the Apollo microcatheter and micro. After review of the images, the guide catheter was removed without incident. FINDINGS: Right internal carotid, head: Injection reveals the presence of a widely patent ICA, M1, and A1 segments and their branches. Of note, the ophthalmic artery has normal origin from the internal carotid. No aneurysms, AVMs, or high-flow fistulas are seen. Interestingly, there are small extracranial branches arising from the anterior surface of the cervical internal carotid which appeared to supply soft  tissues within the prevertebral space. The parenchymal and venous phases are normal. The venous sinuses are widely patent. Right external carotid head: Visualized cranial branches of the external carotid artery are unremarkable. The middle meningeal artery is identified arising from internal maxillary artery. No opacification of the pial veins or dural venous sinuses is seen to suggest any brain perfusion. Right middle meningeal artery, microcatheter run: Angiogram taken from the microcatheter within the middle meningeal artery does not reveal any perfusion of the underlying brain. There are 3 branches of the middle meningeal artery coursing posteriorly which supply the known parasagittal tumor. Right external carotid, post embolization: Angiogram taken from the guide catheter in the external carotid artery reveals onyx cast within the 2 anterior branches of the middle meningeal artery. A single more posteriorly directed branch remains patent and does provide some supply to the posterior aspect of the meningioma. Remainder of the cranial branches of the external carotid artery are  unremarkable. Right femoral: Normal vessel. No significant atherosclerotic disease. Arterial sheath in adequate position. DISPOSITION: Upon completion of the study, the femoral sheath was removed and hemostasis obtained using a 5-Fr ExoSeal closure device. Good proximal and distal lower extremity pulses were documented upon achievement of hemostasis. The procedure was well tolerated and no early complications were observed. The patient was transferred to the postanesthesia care unit in stable hemodynamic condition. IMPRESSION: 1. Successful onyx embolization of the right middle meningeal artery for meningioma as a preoperative adjunct. The preliminary results of this procedure were shared with the patient's family. Electronically Signed   By: Lisbeth Renshaw   On: 04/17/2023 13:54   IR NEURO EACH ADD'L AFTER BASIC UNI RIGHT  (MS)  Result Date: 04/17/2023 PROCEDURE: ONYX EMBOLIZATION OF RIGHT MIDDLE MENINGEAL ARTERY HISTORY: The patient is a 32 year old man a large right parietal meningioma. The patient was seen in the Outpatient neurosurgery clinic where surgical resection of the tumor was recommended. Preoperative middle meningeal artery embolization was requested. ACCESS: The technical aspects of the procedure as well as its potential risks and benefits were reviewed with the patient and family. These risks included but were not limited to stroke, intracranial hemorrhage, bleeding, infection, allergic reaction, damage to organs or vital structures, stroke, non-diagnostic procedure, and the catastrophic outcomes of heart attack, coma, and death. With an understanding of these risks, informed consent was obtained and witnessed. The patient was placed in the supine position on the angiography table and the skin of right groin prepped in the usual sterile fashion. The procedure was performed under general anesthesia. A 5-French sheath was introduced in the right common femoral artery using Seldinger technique. MEDICATIONS: HEPARIN: 2000 Units total. CONTRAST:  42mL OMNIPAQUE IOHEXOL 300 MG/ML  SOLNcc, Omnipaque 300 FLUOROSCOPY TIME:  FLUOROSCOPY TIME: See IR records TECHNIQUE: CATHETERS AND WIRES 5-French Envoy guide catheter 180 cm 0.035" glidewire Apollo microcatheter Synchro 10 microwire EMBOLIC AGENT USED Onyx 18 VESSELS CATHETERIZED Right internal carotid artery Right external carotid artery Right middle meningeal artery Right common femoral VESSELS STUDIED Right internal carotid artery Right external carotid artery Right middle meningeal artery, microcatheter run Right external carotid artery, post-embolization Right common femoral PROCEDURAL NARRATIVE The guide catheter was introduced over the Glidewire and the right external carotid artery was selected. Angiogram was taken. The right internal carotid artery was then selected and  angiogram was taken. After review of the images, I elected to proceed with the embolization. Under roadmap guidance, the guide catheter was again positioned in the right external carotid artery. The microcatheter was introduced over the microwire and the right middle meningeal artery was selected. Microcatheter was then taken to confirm no perfusion to the underlying brain. The catheter was then flushed with DMSO, and under standard roadmap and blank roadmap technique, the middle meningeal artery was embolized. The microcatheter was then removed without incident. Post embolization angiogram was taken through the guide catheter. A second more posteriorly directed branch of the middle meningeal artery was also noted supplying the tumor. Multiple attempts were made to catheterize this vessel with a second Apollo microcatheter over the synchro wire. Unfortunately due to tortuosity at the origin of this more posteriorly directed vessel I was unable to catheterize this branch. I therefore removed the Apollo microcatheter and micro. After review of the images, the guide catheter was removed without incident. FINDINGS: Right internal carotid, head: Injection reveals the presence of a widely patent ICA, M1, and A1 segments and their branches. Of note, the ophthalmic artery has normal  origin from the internal carotid. No aneurysms, AVMs, or high-flow fistulas are seen. Interestingly, there are small extracranial branches arising from the anterior surface of the cervical internal carotid which appeared to supply soft tissues within the prevertebral space. The parenchymal and venous phases are normal. The venous sinuses are widely patent. Right external carotid head: Visualized cranial branches of the external carotid artery are unremarkable. The middle meningeal artery is identified arising from internal maxillary artery. No opacification of the pial veins or dural venous sinuses is seen to suggest any brain perfusion. Right  middle meningeal artery, microcatheter run: Angiogram taken from the microcatheter within the middle meningeal artery does not reveal any perfusion of the underlying brain. There are 3 branches of the middle meningeal artery coursing posteriorly which supply the known parasagittal tumor. Right external carotid, post embolization: Angiogram taken from the guide catheter in the external carotid artery reveals onyx cast within the 2 anterior branches of the middle meningeal artery. A single more posteriorly directed branch remains patent and does provide some supply to the posterior aspect of the meningioma. Remainder of the cranial branches of the external carotid artery are unremarkable. Right femoral: Normal vessel. No significant atherosclerotic disease. Arterial sheath in adequate position. DISPOSITION: Upon completion of the study, the femoral sheath was removed and hemostasis obtained using a 5-Fr ExoSeal closure device. Good proximal and distal lower extremity pulses were documented upon achievement of hemostasis. The procedure was well tolerated and no early complications were observed. The patient was transferred to the postanesthesia care unit in stable hemodynamic condition. IMPRESSION: 1. Successful onyx embolization of the right middle meningeal artery for meningioma as a preoperative adjunct. The preliminary results of this procedure were shared with the patient's family. Electronically Signed   By: Lisbeth Renshaw   On: 04/17/2023 13:54    Antibiotics:  Anti-infectives (From admission, onward)    Start     Dose/Rate Route Frequency Ordered Stop   04/18/23 2200  ceFAZolin (ANCEF) IVPB 2g/100 mL premix        2 g 200 mL/hr over 30 Minutes Intravenous Every 8 hours 04/18/23 1540 04/19/23 0717   04/17/23 0800  ceFAZolin (ANCEF) IVPB 2g/100 mL premix        2 g 200 mL/hr over 30 Minutes Intravenous On call to O.R. 04/17/23 0748 04/17/23 1145       Discharge Exam: Blood pressure 116/73,  pulse 62, temperature 98.3 F (36.8 C), temperature source Oral, resp. rate 10, height 6\' 1"  (1.854 m), weight 76.2 kg, SpO2 96%. Neurologic: Grossly normal Ambulating and voiding well incision cdi   Discharge Medications:   Allergies as of 04/20/2023   No Known Allergies      Medication List     TAKE these medications    escitalopram 10 MG tablet Commonly known as: LEXAPRO Take 10 mg by mouth daily.   HYDROcodone-acetaminophen 5-325 MG tablet Commonly known as: NORCO/VICODIN Take 1 tablet by mouth every 4 (four) hours as needed for moderate pain.   ibuprofen 200 MG tablet Commonly known as: ADVIL Take 200 mg by mouth as needed for moderate pain or headache.   methylPREDNISolone 4 MG Tbpk tablet Commonly known as: MEDROL DOSEPAK Take as directed on packet   naproxen sodium 220 MG tablet Commonly known as: ALEVE Take 220 mg by mouth as needed (headache/pain).   OVER THE COUNTER MEDICATION Take 15 mg by mouth 2 (two) times daily as needed (anxiety). THC gummy 15 mg  Discharge Care Instructions  (From admission, onward)           Start     Ordered   04/20/23 0000  Change dressing (specify)       Comments: Dressing change: as needed   04/20/23 1032            Disposition: home   Final Dx: crani for resection of meningioma  Discharge Instructions     Call MD for:   Complete by: As directed    Call MD for:  difficulty breathing, headache or visual disturbances   Complete by: As directed    Call MD for:  hives   Complete by: As directed    Call MD for:  persistant nausea and vomiting   Complete by: As directed    Call MD for:  redness, tenderness, or signs of infection (pain, swelling, redness, odor or green/yellow discharge around incision site)   Complete by: As directed    Call MD for:  severe uncontrolled pain   Complete by: As directed    Call MD for:  temperature >100.4   Complete by: As directed    Change dressing  (specify)   Complete by: As directed    Dressing change: as needed   Diet - low sodium heart healthy   Complete by: As directed    Driving Restrictions   Complete by: As directed    No driving for 2 weeks, no riding in the car for 1 week   Increase activity slowly   Complete by: As directed           Signed: Tiana Loft Kaylib Furness 04/20/2023, 10:32 AM

## 2023-04-20 NOTE — Progress Notes (Signed)
Dr. Wynetta Emery came by a couple times to be able to answer questions for the patient and his wife. Clarified with Dr. Wynetta Emery that he did not need to be on an antibiotic, he can get his head wet in one week, and ok to take off R femoral drsg. I changed the patient's drsg using gauze and tegaderm per Dr. Lonie Peak request after Dr. Wynetta Emery removed the JP drain. Patient's IVs were taken out, catheter tips intact. I went over other discharge paperwork including medications, pharmacy location, and discharge instructions. Patient and wife felt ready to leave and manage care at home. They had already called to schedule a follow-up appt with Dr. Wynetta Emery outpatient. Patient was wheeled down by nurse tech.

## 2023-04-20 NOTE — Progress Notes (Signed)
Ok to discharge home after lunch as long as tolerating PO pain medication. Discharge orders done.

## 2023-04-20 NOTE — TOC Transition Note (Signed)
Transition of Care (TOC) - CM/SW Discharge Note Donn Pierini RN,BSN Transitions of Care Unit 4NP (Non Trauma)- RN Case Manager See Treatment Team for direct Phone #   Patient Details  Name: Moran Sifuentez MRN: [id number] Date of Birth: 05-Aug-1991  Transition of Care [facility] CM/SW Contact:  Darrold Span, RN Phone Number: 04/20/2023, 11:40 AM   Clinical Narrative:    Pt stable for transition home today, spouse to transport home.  No TOC needs noted.  Pt to follow up as per AVS instructions.    Final next level of care: Home/Self Care Barriers to Discharge: No Barriers Identified   Patient Goals and CMS Choice   Choice offered to / list presented to : NA  Discharge Placement                 Home        Discharge Plan and Services Additional resources added to the After Visit Summary for   In-house Referral: NA Discharge Planning Services: NA Post Acute Care Choice: NA          DME Arranged: N/A DME Agency: NA       HH Arranged: NA HH Agency: NA        Social Determinants of Health (SDOH) Interventions SDOH Screenings   Tobacco Use: Low Risk  (04/17/2023)     Readmission Risk Interventions    04/20/2023   11:40 AM  Readmission Risk Prevention Plan  Post Dischage Appt Complete  Medication Screening Complete  Transportation Screening Complete

## 2023-04-20 NOTE — Discharge Instructions (Signed)
Per Dr. Wynetta Emery, you may get you head wet one week from discharge. Please call [facility] [phone]) to schedule a follow-up visit or for any additional questions.

## 2023-04-21 LAB — BPAM RBC
Blood Product Expiration Date: 202410072359
Blood Product Expiration Date: 202410072359
Blood Product Expiration Date: 202410072359
Blood Product Expiration Date: 202410072359
ISSUE DATE / TIME: 202409111635
ISSUE DATE / TIME: 202409111635
Unit Type and Rh: 6200
Unit Type and Rh: 6200
Unit Type and Rh: 6200
Unit Type and Rh: 6200

## 2023-04-21 LAB — TYPE AND SCREEN
ABO/RH(D): A POS
Antibody Screen: NEGATIVE
Unit division: 0
Unit division: 0
Unit division: 0
Unit division: 0

## 2023-05-16 NOTE — Progress Notes (Deleted)
NEUROLOGY FOLLOW UP OFFICE NOTE  Glenn Hoffman [id number]  Assessment/Plan:   Right parietal meningioma status post resection   ***  Subjective:   Glenn Hoffman is a 32 year old male who follows up for headache and cerebral meningioma.  MRIs personally reviewed.  UPDATE: MRI of brain with and without contrast on 02/28/2023 showed a 6.4 cm right parietal extra-axial meningioma with superior sagittal sinus invasion without brain edema.  Referred to neurosurgery.  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  He underwent resection of the meningioma on 04/17/2023 with right middle meningeal artery embolization.  ***   HISTORY: Symptoms started in March 2024.  No preceding illness, head trauma, change in environment or new/worsening anxiety.  He has had a persistent headache.  Wakes up with dull headache (2/10) and gradually increases later in the day (no higher than 6/10).  He describes a pressure behind both eyes or across the forehead but it may lateralize to either side.  Exacerbated in severity and becomes throbbing with quick head movements or change in position, such as turning head, bending over or straightening up from a flexed position.  During this change in position, he may have associated blurred vision, sometimes actual horizontal or vertical diplopia, or dizziness (not spinning but rather an undulating sensation in his head).  Lasts just a few seconds.  When he moves his eyes, he can feel a tugging sensation behind the eye with pressure in the adjacent temple.  He also endorses aural fullness and has not sometimes pop his ears.  Occasional ringing in the ears.  Some photosenstivity which is  not new.  No nausea, phonophobia, rhinorrhea, congestion, neck pain, jaw pain, numbness or weakness.  Treats with ibuprofen infrequently.  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  He went away on a trip to [place] in April and symptoms had resolved.  But they returned once he came back home.  Head pressure is similar to symptoms he experienced when he had COVID back in early 2021.  No prior history of headaches.  About every 18 months or so, he has had episodes of dizziness and blurred vision but would last no longer than 2 weeks.    PAST MEDICAL HISTORY: Past Medical History:  Diagnosis Date   ADHD    Anxiety    Depression     MEDICATIONS: Current Outpatient Medications on File Prior to Visit  Medication Sig Dispense Refill   escitalopram (LEXAPRO) 10 MG tablet Take 10 mg by mouth daily.     HYDROcodone-acetaminophen (NORCO/VICODIN) 5-325 MG tablet Take 1 tablet by mouth every 4 (four) hours as needed for moderate pain. 30 tablet 0   ibuprofen (ADVIL) 200 MG tablet Take 200 mg by mouth as needed for moderate pain or headache.     methylPREDNISolone (MEDROL DOSEPAK) 4 MG TBPK tablet Take as directed on packet 21 tablet 0   naproxen sodium (ALEVE) 220 MG tablet Take 220 mg by mouth as needed (headache/pain).     OVER THE COUNTER MEDICATION Take 15 mg by mouth 2 (two) times daily as needed (anxiety). THC gummy 15 mg     No current facility-administered medications on file prior to visit.    ALLERGIES: No Known  Allergies  FAMILY HISTORY: Family History  Problem Relation Age of Onset   Stroke Paternal Grandfather    Hemophilia Maternal Great-grandmother       Objective:  *** General: No acute distress.  Patient appears ***-groomed.   Head:  Normocephalic/atraumatic Eyes:  Fundi examined but not visualized Neck: supple, no paraspinal tenderness, full range of motion Heart:  Regular rate and rhythm Lungs:  Clear to auscultation bilaterally Back: No paraspinal tenderness Neurological  Exam: alert and oriented.  Speech fluent and not dysarthric, language intact.  CN II-XII intact. Bulk and tone normal, muscle strength 5/5 throughout.  Sensation to light touch intact.  Deep tendon reflexes 2+ throughout, toes downgoing.  Finger to nose testing intact.  Gait normal, Romberg negative.   Shon Millet, DO  CC: ***

## 2023-05-17 ENCOUNTER — Ambulatory Visit: Payer: Managed Care, Other (non HMO) | Admitting: Neurology

## 2023-05-17 DIAGNOSIS — Z029 Encounter for administrative examinations, unspecified: Secondary | ICD-10-CM

## 2023-05-23 ENCOUNTER — Telehealth: Payer: Self-pay | Admitting: Neurology

## 2023-05-23 NOTE — Telephone Encounter (Signed)
Patient advised of Dr.Jaffe note, I think it would be a good idea that I see him after his surgery for re-evaluation.      Front desk please call patient to schedule a follow up.

## 2023-05-23 NOTE — Telephone Encounter (Signed)
Pt called in stating he got neurosurgery on his brain tumor. He is currently recovering. He accidentally missed an appointment on 05/17/23, but wasn't sure if he needs to reschedule with Dr. Everlena Cooper or if he would get continual care from his neurosurgeon and GP?

## 2023-06-01 ENCOUNTER — Encounter: Payer: Self-pay | Admitting: Orthopedic Surgery

## 2023-06-01 ENCOUNTER — Ambulatory Visit: Payer: Managed Care, Other (non HMO) | Admitting: Orthopedic Surgery

## 2023-06-01 DIAGNOSIS — S43101A Unspecified dislocation of right acromioclavicular joint, initial encounter: Secondary | ICD-10-CM

## 2023-06-01 NOTE — Progress Notes (Unsigned)
Office Visit Note   Patient: Glenn Hoffman           Date of Birth: Nov 16, 1990           MRN: [id number] Visit Date: 06/01/2023 Requested by: Gaspar Garbe, MD [id number] [place],  [place] [address] PCP: Wylene Simmer Adelfa Koh, MD  Subjective: Chief Complaint  Patient presents with  . Right Shoulder - Injury    HPI: Glenn Hoffman is a 32 y.o. male who presents to the office reporting .                ROS: All systems reviewed are negative as they relate to the chief complaint within the history of present illness.  Patient denies fevers or chills.  Assessment & Plan: Visit Diagnoses:  1. AC separation, type 5, right, initial encounter     Plan: ***  Follow-Up Instructions: No follow-ups on file.   Orders:  No orders of the defined types were placed in this encounter.  No orders of the defined types were placed in this encounter.     Procedures: No procedures performed   Clinical Data: No additional findings.  Objective: Vital Signs: There were no vitals taken for this visit.  Physical Exam:  Constitutional: Patient appears well-developed HEENT:  Head: Normocephalic Eyes:EOM are normal Neck: Normal range of motion Cardiovascular: Normal rate Pulmonary/chest: Effort normal Neurologic: Patient is alert Skin: Skin is warm Psychiatric: Patient has normal mood and affect  Ortho Exam: ***  Specialty Comments:  No specialty comments available.  Imaging: No results found.   PMFS History: Patient Active Problem List   Diagnosis Date Noted  . Meningioma (HCC) 04/17/2023   Past Medical History:  Diagnosis Date  . ADHD   . Anxiety   . Depression     Family History  Problem Relation Age of Onset  . Stroke Paternal Grandfather   . Hemophilia Maternal Great-grandmother     Past Surgical History:  Procedure Laterality Date  . APPLICATION OF CRANIAL NAVIGATION N/A 04/18/2023   Procedure: APPLICATION OF CRANIAL NAVIGATION;  Surgeon: Donalee Citrin, MD;   Location: [facility] OR;  Service: Neurosurgery;  Laterality: N/A;  . CRANIOTOMY Right 04/18/2023   Procedure: OCCIPITAL PARIETAL CRANI FOR RESECTION OF PARASAGITTAL CONVEXITY MENINGIOMA;  Surgeon: Donalee Citrin, MD;  Location: [facility] OR;  Service: Neurosurgery;  Laterality: Right;  . IR ANGIO EXTERNAL CAROTID SEL EXT CAROTID UNI R MOD SED  04/17/2023  . IR ANGIO INTRA EXTRACRAN SEL INTERNAL CAROTID UNI R MOD SED  04/17/2023  . IR ANGIOGRAM FOLLOW UP STUDY  04/17/2023  . IR NEURO EACH ADD'L AFTER BASIC UNI RIGHT (MS)  04/17/2023  . IR TRANSCATH/EMBOLIZ  04/17/2023  . RADIOLOGY WITH ANESTHESIA N/A 04/17/2023   Procedure: Right middle meningeal artery embolization;  Surgeon: Lisbeth Renshaw, MD;  Location: [facility] OR;  Service: Radiology;  Laterality: N/A;  . SKIN BIOPSY Right    Right knee- in derm office   Social History   Occupational History  . Not on file  Tobacco Use  . Smoking status: Never  . Smokeless tobacco: Never  Vaping Use  . Vaping status: Some Days  . Substances: THC  Substance and Sexual Activity  . Alcohol use: Yes    Alcohol/week: 7.0 standard drinks of alcohol    Types: 7 Cans of beer per week    Comment: 1-2 beer or wine per day  . Drug use: Yes    Types: Marijuana  . Sexual activity: Not on file

## 2023-06-02 ENCOUNTER — Encounter: Payer: Self-pay | Admitting: Orthopedic Surgery

## 2023-06-06 NOTE — Progress Notes (Signed)
NEUROLOGY FOLLOW UP OFFICE NOTE  Glenn Hoffman [id number]  Assessment/Plan:   Possible seizure Right parietal meningioma status post resection    Remain on Keppra 500mg  twice daily Routine EEG MRI of brain pending Discussed [place] law stating that he should not drive for 6 months after experiencing seizure. Follow up after MRI and EEG.  Total time spent in chart and face to face with patient and his wife:  41 minutes.  Subjective:   Glenn Hoffman is a 32 year old male who follows up for cerebral meningioma and possible recent seizure.  History supplemented by his accompanying wife and neurosurgery notes.  UPDATE: He underwent craniotomy for resection of meningioma on 9/11.  There were no complications and he did well immediately afterwards.  He was on a course of steroids and felt well.  After finishing the steroids, he had slight return of headache and dizziness, but that resolved and he hasn't had any more headaches.  In early October, he developed subgaleal effusions involving his eyes, face and head.  They subsequently resolved.  On 10/24, he wasn't feeling well.  He took a nap.  When he woke up 2 hours later, he had severe right shoulder pain.  He got up and went to the bathroom and noticed a broken soap dish in the sink.  He went to urgent care and was found to have dislocated his shoulder.  Unsure how that happened but concern for possible seizure.  His wife was at work and therefore there were no witnesses.  No diffuse muscle soreness, incontinence or tongue/cheek laceration.  Soon after this incident, he developed another subgaleal effusion.  His neurosurgeon, Dr. Wynetta Emery, started him on Keppra.  He has a brain MRI scheduled.  He reports that he feels fine.  He has been out of work since his surgery with plan to return next week.  He feels that he can do that.  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    HISTORY: Symptoms started in March 2024.  No preceding illness,  head trauma, change in environment or new/worsening anxiety.  He has had a persistent headache.  Wakes up with dull headache (2/10) and gradually increases later in the day (no higher than 6/10).  He describes a pressure behind both eyes or across the forehead but it may lateralize to either side.  Exacerbated in severity and becomes throbbing with quick head movements or change in position, such as turning head, bending over or straightening up from a flexed position.  During this change in position, he may have associated blurred vision, sometimes actual horizontal or vertical diplopia, or dizziness (not spinning but rather an undulating sensation in his head).  Lasts just a few seconds.  When he moves his eyes, he can feel a tugging sensation behind the eye with pressure in the adjacent temple.  He also endorses aural fullness and has not sometimes pop his ears.  Occasional ringing in the ears.  Some photosenstivity which is not new.  No nausea, phonophobia, rhinorrhea, congestion, neck pain, jaw pain, numbness or weakness.  Treats with ibuprofen infrequently.  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  He went away on a trip to [place] in April and symptoms had resolved.  But they returned once he came back home.  Head pressure is similar to symptoms he experienced when he had COVID back in early 2021.  No prior history of headaches.  About every 18 months or so, he has  had episodes of dizziness and blurred vision but would last no longer than 2 weeks.    MRI of brain with and without contrast on 02/28/2023 showed a 6.4 cm right parietal extra-axial meningioma with superior sagittal sinus invasion without brain edema.  Referred to neurosurgery.  Repeat MRI of brain with and without contrast performed on 03/20/2023 along with MRV of head, which revealed stable right parietal meningioma invading and causing severe stenosis of the superior sagittal sinus with moderate mass effect on the underlying parenchyma and mild mass  effect on the right lateral ventricle but no hydrocephalus or shift.  He underwent resection of the meningioma on 04/17/2023 with right middle meningeal artery embolization.   PAST MEDICAL HISTORY: Past Medical History:  Diagnosis Date   ADHD    Anxiety    Depression     MEDICATIONS: Current Outpatient Medications on File Prior to Visit  Medication Sig Dispense Refill   escitalopram (LEXAPRO) 10 MG tablet Take 10 mg by mouth daily.     HYDROcodone-acetaminophen (NORCO/VICODIN) 5-325 MG tablet Take 1 tablet by mouth every 4 (four) hours as needed for moderate pain. 30 tablet 0   ibuprofen (ADVIL) 200 MG tablet Take 200 mg by mouth as needed for moderate pain or headache.     methylPREDNISolone (MEDROL DOSEPAK) 4 MG TBPK tablet Take as directed on packet 21 tablet 0   naproxen sodium (ALEVE) 220 MG tablet Take 220 mg by mouth as needed (headache/pain).     OVER THE COUNTER MEDICATION Take 15 mg by mouth 2 (two) times daily as needed (anxiety). THC gummy 15 mg     No current facility-administered medications on file prior to visit.    ALLERGIES: No Known Allergies  FAMILY HISTORY: Family History  Problem Relation Age of Onset   Stroke Paternal Grandfather    Hemophilia Maternal Great-grandmother       Objective:  Blood pressure 132/75, pulse 74, height 6\' 1"  (1.854 m), weight 170 lb (77.1 kg), SpO2 94%. General: No acute distress.  Patient appears well-groomed.   Head:  Normocephalic/atraumatic Eyes:  Fundi examined but not visualized Neck: supple, no paraspinal tenderness, full range of motion Heart:  Regular rate and rhythm Neurological Exam: alert and oriented.  Speech fluent and not dysarthric, language intact.  CN II-XII intact. Bulk and tone normal, muscle strength 5/5 throughout.  Sensation to light touch intact.  Deep tendon reflexes 2+ throughout, toes downgoing.  Finger to nose testing intact.  Gait normal, able to turn and tandem walk.  Romberg negative.   Glenn Millet, DO  CC: Glenn Bruin, MD

## 2023-06-08 ENCOUNTER — Ambulatory Visit: Payer: Managed Care, Other (non HMO) | Admitting: Neurology

## 2023-06-08 ENCOUNTER — Encounter: Payer: Self-pay | Admitting: Neurology

## 2023-06-08 VITALS — BP 132/75 | HR 74 | Ht 73.0 in | Wt 170.0 lb

## 2023-06-08 DIAGNOSIS — Z9889 Other specified postprocedural states: Secondary | ICD-10-CM | POA: Diagnosis not present

## 2023-06-08 DIAGNOSIS — Z86018 Personal history of other benign neoplasm: Secondary | ICD-10-CM | POA: Diagnosis not present

## 2023-06-08 DIAGNOSIS — R4020 Unspecified coma: Secondary | ICD-10-CM | POA: Diagnosis not present

## 2023-06-08 NOTE — Patient Instructions (Addendum)
1. Continue levetiracetam 500mg  twice daily  2. Avoid activities in which a seizure would cause danger to yourself or to others.  Don't operate dangerous machinery, swim alone, or climb in high or dangerous places, such as on ladders, roofs, or girders.  Do not drive unless your doctor says you may.  3. If you have any warning that you may have a seizure, lay down in a safe place where you can't hurt yourself.    4.  No driving for 6 months from last seizure, as per [facility].   Please refer to the following link on the Epilepsy Foundation of America's website for more information: http://www.epilepsyfoundation.org/answerplace/Social/driving/drivingu.cfm   5.  Maintain good sleep hygiene.  6.  Notify your neurology if you are planning pregnancy or if you become pregnant.  7.  Contact your doctor if you have any problems that may be related to the medicine you are taking.  8.  Call 911 and bring the patient back to the ED if:        A.  The seizure lasts longer than 5 minutes.       B.  The patient doesn't awaken shortly after the seizure  C.  The patient has new problems such as difficulty seeing, speaking or moving  D.  The patient was injured during the seizure  E.  The patient has a temperature over 102 F (39C)  F.  The patient vomited and now is having trouble breathing 9.  Check EEG 10.  MRI scheduled on Nov 19 11.  Follow up about a week afterwards.

## 2023-06-12 ENCOUNTER — Ambulatory Visit (INDEPENDENT_AMBULATORY_CARE_PROVIDER_SITE_OTHER): Payer: Managed Care, Other (non HMO) | Admitting: Neurology

## 2023-06-12 DIAGNOSIS — R4020 Unspecified coma: Secondary | ICD-10-CM | POA: Diagnosis not present

## 2023-06-12 DIAGNOSIS — Z86018 Personal history of other benign neoplasm: Secondary | ICD-10-CM

## 2023-06-12 NOTE — Progress Notes (Unsigned)
EEG complete - results pending 

## 2023-06-13 ENCOUNTER — Ambulatory Visit: Payer: Managed Care, Other (non HMO) | Admitting: Neurology

## 2023-06-13 ENCOUNTER — Telehealth: Payer: Self-pay | Admitting: Neurology

## 2023-06-13 NOTE — Telephone Encounter (Signed)
Patient advised of results.

## 2023-06-13 NOTE — Telephone Encounter (Signed)
EEG is normal.  That doesn't rule out anything.  I think seizure is probable and would continue levetiracetam 500mg  twice daily.

## 2023-06-13 NOTE — Progress Notes (Signed)
ELECTROENCEPHALOGRAM REPORT  Date of Study: 06/12/2023  Patient's Name: Glenn Hoffman MRN: [id number] Date of Birth: Nov 03, 1990   Clinical History: 32 year old male status post right pareital meningioma resection on 04/18/2023 who had possible seizure on 05/31/2023.  Medications: Levetiracetam 500mg  twice daily Escitalopram 10mg  daily   Technical Summary: A multichannel digital EEG recording measured by the international 10-20 system with electrodes applied with paste and impedances below 5000 ohms performed in our laboratory with EKG monitoring in an awake and drowsy patient.  Hyperventilation and photic stimulation were performed.  The digital EEG was referentially recorded, reformatted, and digitally filtered in a variety of bipolar and referential montages for optimal display.    Description: The patient is awake and drowsy during the recording.  During maximal wakefulness, there is a symmetric, medium voltage 11-12 Hz posterior dominant rhythm that attenuates with eye opening.  The record is symmetric.  Stage 2 sleep was not seen.  Hyperventilation and photic stimulation did not elicit any abnormalities.  There were no epileptiform discharges or electrographic seizures seen.    EKG lead was unremarkable.  Impression: This awake and drowsy EEG is normal.    Clinical Correlation: A normal EEG does not exclude a clinical diagnosis of epilepsy.  If further clinical questions remain, prolonged EEG may be helpful.  Clinical correlation is advised.   Shon Millet, DO

## 2023-06-26 ENCOUNTER — Other Ambulatory Visit: Payer: Self-pay | Admitting: Radiation Therapy

## 2023-07-02 ENCOUNTER — Other Ambulatory Visit: Payer: Self-pay | Admitting: Internal Medicine

## 2023-07-02 DIAGNOSIS — D329 Benign neoplasm of meninges, unspecified: Secondary | ICD-10-CM

## 2023-07-06 LAB — SURGICAL PATHOLOGY

## 2023-07-09 ENCOUNTER — Ambulatory Visit: Payer: Managed Care, Other (non HMO) | Admitting: Orthopedic Surgery

## 2023-07-09 ENCOUNTER — Encounter: Payer: Self-pay | Admitting: Orthopedic Surgery

## 2023-07-09 DIAGNOSIS — S43101A Unspecified dislocation of right acromioclavicular joint, initial encounter: Secondary | ICD-10-CM | POA: Diagnosis not present

## 2023-07-09 NOTE — Progress Notes (Signed)
Office Visit Note   Patient: Glenn Hoffman           Date of Birth: Feb 06, 1991           MRN: [id number] Visit Date: 07/09/2023 Requested by: Gaspar Garbe, MD [address] [place],  [place] [address] PCP: Wylene Simmer Adelfa Koh, MD  Subjective: Chief Complaint  Patient presents with   Right Shoulder - Follow-up    HPI: Glenn Hoffman is a 32 y.o. male who presents to the office reporting right shoulder pain.  He is now about 6 weeks out right grade 5 AC separation.  Doing better overall.  Feels like his range of motion is better but he still has a cosmetic defect.  Not really having any pain.  Strength is he says is okay.  Has a little bit of twinging at night occasionally.  Not taking any medication.  Is able to sleep on the right-hand side at times.  Overall he is around 90% recovered.  He is moving out at the end of January from his house.  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                ROS: All systems reviewed are negative as they relate to the chief complaint within the history of present illness.  Patient denies fevers or chills.  Assessment & Plan: Visit Diagnoses:  1. AC separation, type 5, right, initial encounter     Plan: Impression is fairly asymptomatic grade 5 AC separation.  Plan is to be careful lifting in late January which will be 3 months out.  3 months return for final check but if he is doing well he would not need to keep that.  Overall the distal clavicle seems to have scarred in very nicely into its new location and there is no evidence of instability with crossarm adduction.  No evidence of labral pathology on exam.  Follow-up as needed in 3 months.  Follow-Up Instructions: No follow-ups on file.   Orders:  No orders of the defined types were placed in this encounter.  No orders of the defined types were placed in this encounter.     Procedures: No procedures performed   Clinical Data: No additional findings.  Objective: Vital Signs: There were no  vitals taken for this visit.  Physical Exam:  Constitutional: Patient appears well-developed HEENT:  Head: Normocephalic Eyes:EOM are normal Neck: Normal range of motion Cardiovascular: Normal rate Pulmonary/chest: Effort normal Neurologic: Patient is alert Skin: Skin is warm Psychiatric: Patient has normal mood and affect  Ortho Exam: Ortho exam demonstrates full active and passive range of motion of both shoulders.  Range of motion is approximately 70/110/180.  No discrete tenderness over the right AC joint.  Rotator cuff strength is excellent infraspinatus supraspinatus and subscap muscle testing.  Cosmetic deformity is present but fairly minimal in terms of all of the grade 5 AC separation set up seen.  Specialty Comments:  No specialty comments available.  Imaging: No results found.   PMFS History: Patient Active Problem List   Diagnosis Date Noted   Meningioma (HCC) 04/17/2023   Past Medical History:  Diagnosis Date   ADHD    Anxiety    Depression     Family History  Problem Relation Age of Onset   Stroke Paternal Grandfather    Hemophilia Maternal Great-grandmother     Past Surgical History:  Procedure Laterality Date   APPLICATION OF CRANIAL NAVIGATION N/A 04/18/2023   Procedure: APPLICATION OF  CRANIAL NAVIGATION;  Surgeon: Donalee Citrin, MD;  Location: [facility] OR;  Service: Neurosurgery;  Laterality: N/A;   CRANIOTOMY Right 04/18/2023   Procedure: OCCIPITAL PARIETAL CRANI FOR RESECTION OF PARASAGITTAL CONVEXITY MENINGIOMA;  Surgeon: Donalee Citrin, MD;  Location: [facility] OR;  Service: Neurosurgery;  Laterality: Right;   IR ANGIO EXTERNAL CAROTID SEL EXT CAROTID UNI R MOD SED  04/17/2023   IR ANGIO INTRA EXTRACRAN SEL INTERNAL CAROTID UNI R MOD SED  04/17/2023   IR ANGIOGRAM FOLLOW UP STUDY  04/17/2023   IR NEURO EACH ADD'L AFTER BASIC UNI RIGHT (MS)  04/17/2023   IR TRANSCATH/EMBOLIZ  04/17/2023   RADIOLOGY WITH ANESTHESIA N/A 04/17/2023   Procedure: Right middle meningeal artery  embolization;  Surgeon: Lisbeth Renshaw, MD;  Location: [facility] OR;  Service: Radiology;  Laterality: N/A;   SKIN BIOPSY Right    Right knee- in derm office   Social History   Occupational History   Not on file  Tobacco Use   Smoking status: Never   Smokeless tobacco: Never  Vaping Use   Vaping status: Some Days   Substances: THC  Substance and Sexual Activity   Alcohol use: Yes    Alcohol/week: 7.0 standard drinks of alcohol    Types: 7 Cans of beer per week    Comment: 1-2 beer or wine per day   Drug use: Yes    Types: Marijuana   Sexual activity: Not on file

## 2023-07-10 NOTE — Progress Notes (Unsigned)
NEUROLOGY FOLLOW UP OFFICE NOTE  Glenn Hoffman [id number]  Assessment/Plan:   Probable seizure, likely secondary to residual meningioma Right parietal meningioma status post resection    Remain on Keppra 500mg  twice daily Will obtain MRI report from neurosurgery Has appointment with Dr. Barbaraann Cao from neuro-oncology. Discussed [place] law stating that he should not drive for 6 months after experiencing seizure. Follow up 3 months.    Subjective:   Glenn Hoffman is a 32 year old male who follows up for cerebral meningioma and possible recent seizure.   UPDATE: Awake and drowsy routine EEG on 06/12/2023 was normal.  He reportedly had a new MRI of the brain a couple of weeks ago (not in system).  Reportedly showed no change compared to the previous one in September.  Has upcoming appointment with Dr. Barbaraann Cao of neuro-oncology to discuss how to resect residual meningioma.  Plan is to repeat MRI prior to seeing Dr. Barbaraann Cao.  Still on Keppra.  No seizures.  No headaches.  Feeling well  HISTORY: Symptoms started in March 2024.  No preceding illness, head trauma, change in environment or new/worsening anxiety.  He has had a persistent headache.  Wakes up with dull headache (2/10) and gradually increases later in the day (no higher than 6/10).  He describes a pressure behind both eyes or across the forehead but it may lateralize to either side.  Exacerbated in severity and becomes throbbing with quick head movements or change in position, such as turning head, bending over or straightening up from a flexed position.  During this change in position, he may have associated blurred vision, sometimes actual horizontal or vertical diplopia, or dizziness (not spinning but rather an undulating sensation in his head).  Lasts just a few seconds.  When he moves his eyes, he can feel a tugging sensation behind the eye with pressure in the adjacent temple.  He also endorses aural fullness and has not sometimes pop his  ears.  Occasional ringing in the ears.  Some photosenstivity which is not new.  No nausea, phonophobia, rhinorrhea, congestion, neck pain, jaw pain, numbness or weakness.  Treats with ibuprofen infrequently.  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  He went away on a trip to [place] in April and symptoms had resolved.  But they returned once he came back home.  Head pressure is similar to symptoms he experienced when he had COVID back in early 2021.  No prior history of headaches.  About every 18 months or so, he has had episodes of dizziness and blurred vision but would last no longer than 2 weeks.  MRI of brain with and without contrast on 02/28/2023 showed a 6.4 cm right parietal extra-axial meningioma with superior sagittal sinus invasion without brain edema.  Referred to neurosurgery.  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  He underwent craniotomy with resection of the meningioma on 04/17/2023 with right middle meningeal artery embolization. There were no complications and he did well immediately afterwards.  He was on a course of steroids and felt well.  After finishing the steroids, he had slight return of headache and dizziness, but that resolved and he hasn't had any more headaches.  In early October, he developed subgaleal effusions  involving his eyes, face and head.  They subsequently resolved.  On 05/31/2023, he wasn't feeling well.  He took a nap.  When he woke up 2 hours later, he had severe right shoulder pain.  He got up and went to the bathroom and noticed a broken soap dish in the sink.  He went to urgent care and was found to have dislocated his shoulder.  Unsure how that happened but concern for  possible seizure.  His wife was at work and therefore there were no witnesses.  No diffuse muscle soreness, incontinence or tongue/cheek laceration.  Soon after this incident, he developed another subgaleal effusion.  His neurosurgeon, Dr. Wynetta Emery, started him on Keppra.   PAST MEDICAL HISTORY: Past Medical History:  Diagnosis Date   ADHD    Anxiety    Depression     MEDICATIONS: Current Outpatient Medications on File Prior to Visit  Medication Sig Dispense Refill   escitalopram (LEXAPRO) 10 MG tablet Take 10 mg by mouth daily.     HYDROcodone-acetaminophen (NORCO/VICODIN) 5-325 MG tablet Take 1 tablet by mouth every 4 (four) hours as needed for moderate pain. (Patient not taking: Reported on 06/08/2023) 30 tablet 0   ibuprofen (ADVIL) 200 MG tablet Take 200 mg by mouth as needed for moderate pain or headache.     levETIRAcetam (KEPPRA) 500 MG tablet Take 500 mg by mouth 2 (two) times daily.     naproxen sodium (ALEVE) 220 MG tablet Take 220 mg by mouth as needed (headache/pain).     OVER THE COUNTER MEDICATION Take 15 mg by mouth 2 (two) times daily as needed (anxiety). THC gummy 15 mg     No current facility-administered medications on file prior to visit.    ALLERGIES: No Known Allergies  FAMILY HISTORY: Family History  Problem Relation Age of Onset   Stroke Paternal Grandfather    Hemophilia Maternal Great-grandmother       Objective:  Blood pressure 117/75, pulse 62, height 6\' 1"  (1.854 m), weight 175 lb (79.4 kg), SpO2 98%. General: No acute distress.  Patient appears well-groomed.   Head:  Normocephalic/atraumatic Eyes:  Fundi examined but not visualized Neck: supple, no paraspinal tenderness, full range of motion Heart:  Regular rate and rhythm Neurological Exam: alert and oriented.  Speech fluent and not dysarthric, language intact.  CN II-XII intact. Bulk and tone normal, muscle strength 5/5 throughout.  Sensation to light touch intact.  Deep tendon reflexes 2+  throughout.  Finger to nose testing intact.  Gait normal, Romberg negative.   Shon Millet, DO  CC: Guerry Bruin, MD

## 2023-07-11 ENCOUNTER — Ambulatory Visit: Payer: Managed Care, Other (non HMO) | Admitting: Neurology

## 2023-07-11 ENCOUNTER — Encounter: Payer: Self-pay | Admitting: Neurology

## 2023-07-11 VITALS — BP 117/75 | HR 62 | Ht 73.0 in | Wt 175.0 lb

## 2023-07-11 DIAGNOSIS — Z86018 Personal history of other benign neoplasm: Secondary | ICD-10-CM | POA: Diagnosis not present

## 2023-07-11 DIAGNOSIS — R569 Unspecified convulsions: Secondary | ICD-10-CM

## 2023-07-11 DIAGNOSIS — Z9889 Other specified postprocedural states: Secondary | ICD-10-CM

## 2023-07-11 NOTE — Patient Instructions (Signed)
1. Continue Keppra (levetiracetam) 500mg  twice daily  2. Avoid activities in which a seizure would cause danger to yourself or to others.  Don't operate dangerous machinery, swim alone, or climb in high or dangerous places, such as on ladders, roofs, or girders.  Do not drive unless your doctor says you may.  3. If you have any warning that you may have a seizure, lay down in a safe place where you can't hurt yourself.    4.  No driving for 6 months from last seizure, as per [facility].   Please refer to the following link on the Epilepsy Foundation of America's website for more information: http://www.epilepsyfoundation.org/answerplace/Social/driving/drivingu.cfm   5.  Maintain good sleep hygiene.  6.  Contact your doctor if you have any problems that may be related to the medicine you are taking.  7.  Call 911 and bring the patient back to the ED if:        A.  The seizure lasts longer than 5 minutes.       B.  The patient doesn't awaken shortly after the seizure  C.  The patient has new problems such as difficulty seeing, speaking or moving  D.  The patient was injured during the seizure  E.  The patient has a temperature over 102 F (39C)  F.  The patient vomited and now is having trouble breathing

## 2023-08-28 ENCOUNTER — Telehealth: Payer: Self-pay | Admitting: Neurology

## 2023-08-28 NOTE — Telephone Encounter (Signed)
Per Dr.Jaffe, It depends what the MRI shows.  LMOVM for patient.

## 2023-08-28 NOTE — Telephone Encounter (Signed)
Per patient 9 days ago, which is four months after his surgery.  He notice some pressure behind his eyes, Dizziness off balance right side, right ear discomfort, headache not as bad as before his surgery.   Please advise

## 2023-08-28 NOTE — Telephone Encounter (Signed)
Patient wants to speak with someone about  that he is having  Dizziness  and headache  like he was having before surgery. He is 4 months out if surgery.   He is having a MRI 09-20-23

## 2023-08-29 NOTE — Progress Notes (Signed)
NEUROLOGY FOLLOW UP OFFICE NOTE  Glenn Hoffman [id number]  Assessment/Plan:   Recurrence of symptoms:  headache, dizziness Probable isolated seizure, likely secondary to residual meningioma Right parietal meningioma status post resection    Would like to move up MRI of brain (currently scheduled for 2/13).  As it is ordered by neurosurgery with SRS protocol, I am unable to do that.  Tried reaching out to neurosurgery but unable to contact them.  Patient will try reaching out to them to move up MRI with SRS protocol.  If not, we will go ahead and order routine stat MRI of brain with and without contrast Remain on Keppra 500mg  twice daily Will obtain MRI report from neurosurgery Has appointment with Dr. Barbaraann Cao from neuro-oncology. Discussed [place] law stating that he should not drive for 6 months after experiencing seizure. Follow up on 3/5 as scheduled.     Subjective:   Glenn Hoffman is a 33 year old male who follows up for cerebral meningioma and possible recent seizure.   UPDATE: Current medications:  Keppra 500mg  twice daily  On 1/12 - dizziness returned and unsteadiness and pressure behind eyes - not manifested as splitting headaches.  When shift and change posture - feels swashing water movement, right ear has aural fullness  HISTORY: Symptoms started in March 2024.  No preceding illness, head trauma, change in environment or new/worsening anxiety.  He has had a persistent headache.  Wakes up with dull headache (2/10) and gradually increases later in the day (no higher than 6/10).  He describes a pressure behind both eyes or across the forehead but it may lateralize to either side.  Exacerbated in severity and becomes throbbing with quick head movements or change in position, such as turning head, bending over or straightening up from a flexed position.  During this change in position, he may have associated blurred vision, sometimes actual horizontal or vertical diplopia, or  dizziness (not spinning but rather an undulating sensation in his head).  Lasts just a few seconds.  When he moves his eyes, he can feel a tugging sensation behind the eye with pressure in the adjacent temple.  He also endorses aural fullness and has not sometimes pop his ears.  Occasional ringing in the ears.  Some photosenstivity which is not new.  No nausea, phonophobia, rhinorrhea, congestion, neck pain, jaw pain, numbness or weakness.  Treats with ibuprofen infrequently.  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  He went away on a trip to [place] in April and symptoms had resolved.  But they returned once he came back home.  Head pressure is similar to symptoms he experienced when he had COVID back in early 2021.  No prior history of headaches.  About every 18 months or so, he has had episodes of dizziness and blurred vision but would last no longer than 2 weeks.  MRI of brain with and without contrast on 02/28/2023 showed a 6.4 cm right parietal extra-axial meningioma with superior sagittal sinus invasion without brain edema.  Referred to neurosurgery.  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  He underwent craniotomy with resection of the meningioma on 04/17/2023 with right middle meningeal artery embolization. There were no complications and he did well  immediately afterwards.  He was on a course of steroids and felt well.  After finishing the steroids, he had slight return of headache and dizziness, but that resolved and he hasn't had any more headaches.  In early October, he developed subgaleal effusions involving his eyes, face and head.  They subsequently resolved.  On 05/31/2023, he wasn't feeling well.  He took a  nap.  When he woke up 2 hours later, he had severe right shoulder pain.  He got up and went to the bathroom and noticed a broken soap dish in the sink.  He went to urgent care and was found to have dislocated his shoulder.  Unsure how that happened but concern for possible seizure.  His wife was at work and therefore there were no witnesses.  No diffuse muscle soreness, incontinence or tongue/cheek laceration.  Soon after this incident, he developed another subgaleal effusion.  His neurosurgeon, Dr. Wynetta Emery, started him on Keppra. Awake and drowsy routine EEG on 06/12/2023 was normal.  He reportedly had a new MRI of the brain a couple of weeks ago (not in system).  Reportedly showed no change compared to the previous one in September.  Has upcoming appointment with Dr. Barbaraann Cao of neuro-oncology to discuss how to resect residual meningioma.   PAST MEDICAL HISTORY: Past Medical History:  Diagnosis Date   ADHD    Anxiety    Depression     MEDICATIONS: Current Outpatient Medications on File Prior to Visit  Medication Sig Dispense Refill   escitalopram (LEXAPRO) 10 MG tablet Take 10 mg by mouth daily.     HYDROcodone-acetaminophen (NORCO/VICODIN) 5-325 MG tablet Take 1 tablet by mouth every 4 (four) hours as needed for moderate pain. (Patient not taking: Reported on 07/11/2023) 30 tablet 0   ibuprofen (ADVIL) 200 MG tablet Take 200 mg by mouth as needed for moderate pain or headache. (Patient not taking: Reported on 07/11/2023)     levETIRAcetam (KEPPRA) 500 MG tablet Take 500 mg by mouth 2 (two) times daily.     naproxen sodium (ALEVE) 220 MG tablet Take 220 mg by mouth as needed (headache/pain). (Patient not taking: Reported on 07/11/2023)     OVER THE COUNTER MEDICATION Take 15 mg by mouth 2 (two) times daily as needed (anxiety). THC gummy 15 mg     No current facility-administered medications on file prior to visit.    ALLERGIES: No Known Allergies  FAMILY HISTORY: Family History  Problem Relation  Age of Onset   Stroke Paternal Grandfather    Hemophilia Maternal Great-grandmother       Objective:  Blood pressure (!) 134/92, pulse 82, height 6\' 1"  (1.854 m), weight 172 lb (78 kg), SpO2 100%. General: No acute distress.  Patient appears well-groomed.   Head:  Normocephalic/atraumatic Eyes:  Fundi examined but not visualized Neck: supple, no paraspinal tenderness, full range of motion Heart:  Regular rate and rhythm Neurological Exam: alert and oriented.  Speech fluent and not dysarthric, language intact.  CN II-XII intact. Bulk and tone normal, muscle strength 5/5 throughout.  Sensation to pinprick and vibration intact.  Deep tendon reflexes 2+ throughout, toes downgoing.  Finger to nose testing intact.  Gait normal, Romberg negative.   Shon Millet, DO  CC: Guerry Bruin, MD

## 2023-08-30 ENCOUNTER — Ambulatory Visit: Payer: Managed Care, Other (non HMO) | Admitting: Neurology

## 2023-08-30 VITALS — BP 134/92 | HR 82 | Ht 73.0 in | Wt 172.0 lb

## 2023-08-30 DIAGNOSIS — Z86018 Personal history of other benign neoplasm: Secondary | ICD-10-CM

## 2023-08-30 DIAGNOSIS — Z9889 Other specified postprocedural states: Secondary | ICD-10-CM | POA: Diagnosis not present

## 2023-08-30 DIAGNOSIS — Z87898 Personal history of other specified conditions: Secondary | ICD-10-CM

## 2023-08-30 NOTE — Patient Instructions (Signed)
Move up MRI of brain with and without contrast urgently, as well as MRV of head Continue Keppra Follow up on 3/5 as scheduled.

## 2023-08-31 ENCOUNTER — Encounter: Payer: Self-pay | Admitting: Neurology

## 2023-09-04 ENCOUNTER — Telehealth: Payer: Self-pay | Admitting: *Deleted

## 2023-09-04 ENCOUNTER — Telehealth: Payer: Self-pay | Admitting: Neurology

## 2023-09-04 NOTE — Telephone Encounter (Signed)
Pt called in and left a message. He stated he was hoping to speak with [facility]

## 2023-09-04 NOTE — Telephone Encounter (Signed)
-----   Message from Henreitta Leber sent at 09/04/2023  4:07 PM EST ----- Regarding: RE: No, were ok as is ----- Message ----- From: Arville Care, RN Sent: 09/04/2023   3:55 PM EST To: Henreitta Leber, MD  I spoke with this patient to get some more information - he had his parietal craniotomy on 9/11, prior to this, he was having dizziness, pressure behind his eyes, and HA's.  These sx's went away after his surgery but have recurred since approximately 1/10.  He has daily HA's, denies any seizures.  He spoke with Dr Everlena Cooper who advised him to see if he could have his MRI sooner & also suggested the MRV.  Let me know if you want things moved up.  Thanks, Darel Hong

## 2023-09-04 NOTE — Telephone Encounter (Signed)
PC to patient, informed him of Dr Liana Gerold recommendation as below.  He verbalizes understanding & will keep appointments as they are.

## 2023-09-05 NOTE — Telephone Encounter (Signed)
Per patient he was advised Per Neurosurgeon office to wait for the MRI scheduled 2/13 and not add additional Imaging for now or move up the appt.   Dr,jaffe advised verbally by [facility]

## 2023-09-06 ENCOUNTER — Encounter: Payer: Self-pay | Admitting: Internal Medicine

## 2023-09-20 ENCOUNTER — Ambulatory Visit
Admission: RE | Admit: 2023-09-20 | Discharge: 2023-09-20 | Disposition: A | Discharge status: Home / Self Care | Payer: Managed Care, Other (non HMO) | Source: Ambulatory Visit | Attending: Internal Medicine | Admitting: Internal Medicine

## 2023-09-20 DIAGNOSIS — D329 Benign neoplasm of meninges, unspecified: Secondary | ICD-10-CM

## 2023-09-20 MED ORDER — GADOPICLENOL 0.5 MMOL/ML IV SOLN
7.5000 mL | Freq: Once | INTRAVENOUS | Status: AC | PRN
  Administered 2023-09-20: 7.5 mL via INTRAVENOUS

## 2023-09-24 ENCOUNTER — Encounter: Payer: Self-pay | Admitting: Internal Medicine

## 2023-09-24 ENCOUNTER — Inpatient Hospital Stay: Payer: Managed Care, Other (non HMO) | Attending: Internal Medicine | Admitting: Internal Medicine

## 2023-09-24 VITALS — BP 117/63 | HR 55 | Temp 97.5°F | Resp 18 | Ht 73.0 in | Wt 172.4 lb

## 2023-09-24 DIAGNOSIS — D320 Benign neoplasm of cerebral meninges: Secondary | ICD-10-CM

## 2023-09-24 DIAGNOSIS — C320 Malignant neoplasm of glottis: Secondary | ICD-10-CM | POA: Insufficient documentation

## 2023-09-24 DIAGNOSIS — D329 Benign neoplasm of meninges, unspecified: Secondary | ICD-10-CM

## 2023-09-24 NOTE — Progress Notes (Signed)
[facility] [address]  [place], [place] [address] [phone]   New Patient Evaluation  Date of Service: 09/24/23 Patient Name: Glenn Hoffman Patient MRN: [id number] Patient DOB: 1991/08/03 Provider: Henreitta Leber, MD  Identifying Statement:  Glenn Hoffman is a 33 y.o. male with left parietal meningioma who presents for initial consultation and evaluation.    Referring Provider: Gaspar Garbe, MD [address] [place],  [place] [address]  Oncologic History: 04/18/23: Left parietal mass resected by Dr. Wynetta Emery; path is WHO grade 1 meningioma   History of Present Illness: The patient's records from the referring physician were obtained and reviewed and the patient interviewed to confirm this HPI.  Glenn Hoffman presents today for evaluation following recent MRI brain.  He initially underwent craniotomy, resection of left parietal meningioma with Dr. Wynetta Emery in September 2024, preceded by multi-vessel embolization with Dr. Conchita Paris.  Initial symptoms were headaches, imbalance, which mostly improved after surgery.  He still has some tension headaches, mild subjective imbalance, usually more prominent in the morning.  He did have an episode concerning for focal seizure in October; he woke up from a nap in a confused state with a dislocated shoulder.  Has been dosing keppra 500mg  twice per day since that time without issue.  Medications: Current Outpatient Medications on File Prior to Visit  Medication Sig Dispense Refill   escitalopram (LEXAPRO) 10 MG tablet Take 10 mg by mouth daily.     HYDROcodone-acetaminophen (NORCO/VICODIN) 5-325 MG tablet Take 1 tablet by mouth every 4 (four) hours as needed for moderate pain. (Patient not taking: Reported on 08/30/2023) 30 tablet 0   ibuprofen (ADVIL) 200 MG tablet Take 200 mg by mouth as needed for moderate pain (pain score 4-6) or headache.     levETIRAcetam (KEPPRA) 500 MG tablet Take 500 mg by mouth 2  (two) times daily.     naproxen sodium (ALEVE) 220 MG tablet Take 220 mg by mouth as needed (headache/pain). (Patient not taking: Reported on 08/30/2023)     OVER THE COUNTER MEDICATION Take 15 mg by mouth 2 (two) times daily as needed (anxiety). THC gummy 15 mg     No current facility-administered medications on file prior to visit.    Allergies: No Known Allergies Past Medical History:  Past Medical History:  Diagnosis Date   ADHD    Anxiety    Depression    Past Surgical History:  Past Surgical History:  Procedure Laterality Date   APPLICATION OF CRANIAL NAVIGATION N/A 04/18/2023   Procedure: APPLICATION OF CRANIAL NAVIGATION;  Surgeon: Donalee Citrin, MD;  Location: [facility] OR;  Service: Neurosurgery;  Laterality: N/A;   CRANIOTOMY Right 04/18/2023   Procedure: OCCIPITAL PARIETAL CRANI FOR RESECTION OF PARASAGITTAL CONVEXITY MENINGIOMA;  Surgeon: Donalee Citrin, MD;  Location: [facility] OR;  Service: Neurosurgery;  Laterality: Right;   IR ANGIO EXTERNAL CAROTID SEL EXT CAROTID UNI R MOD SED  04/17/2023   IR ANGIO INTRA EXTRACRAN SEL INTERNAL CAROTID UNI R MOD SED  04/17/2023   IR ANGIOGRAM FOLLOW UP STUDY  04/17/2023   IR NEURO EACH ADD'L AFTER BASIC UNI RIGHT (MS)  04/17/2023   IR TRANSCATH/EMBOLIZ  04/17/2023   RADIOLOGY WITH ANESTHESIA N/A 04/17/2023   Procedure: Right middle meningeal artery embolization;  Surgeon: Lisbeth Renshaw, MD;  Location: [facility] OR;  Service: Radiology;  Laterality: N/A;   SKIN BIOPSY Right    Right knee- in derm office   Social History:  Social History  Socioeconomic History   Marital status: Married    Spouse name: Not on file   Number of children: Not on file   Years of education: Not on file   Highest education level: Not on file  Occupational History   Not on file  Tobacco Use   Smoking status: Never   Smokeless tobacco: Never  Vaping Use   Vaping status: Some Days   Substances: THC  Substance and Sexual Activity   Alcohol use: Yes    Alcohol/week: 7.0  standard drinks of alcohol    Types: 7 Cans of beer per week    Comment: 1-2 beer or wine per day   Drug use: Yes    Types: Marijuana   Sexual activity: Not on file  Other Topics Concern   Not on file  Social History Narrative   Right [id number]   Social Drivers of Health   Financial Resource Strain: Not on file  Food Insecurity: Not on file  Transportation Needs: Not on file  Physical Activity: Not on file  Stress: Not on file  Social Connections: Not on file  Intimate Partner Violence: Not on file   Family History:  Family History  Problem Relation Age of Onset   Stroke Paternal Grandfather    Hemophilia Maternal Great-grandmother     Review of Systems: Constitutional: Doesn't report fevers, chills or abnormal weight loss Eyes: Doesn't report blurriness of vision Ears, nose, mouth, throat, and face: Doesn't report sore throat Respiratory: Doesn't report cough, dyspnea or wheezes Cardiovascular: Doesn't report palpitation, chest discomfort  Gastrointestinal:  Doesn't report nausea, constipation, diarrhea GU: Doesn't report incontinence Skin: Doesn't report skin rashes Neurological: Per HPI Musculoskeletal: Doesn't report joint pain Behavioral/Psych: Doesn't report anxiety  Physical Exam: Vitals:   09/24/23 0854  BP: 117/63  Pulse: (!) 55  Resp: 18  Temp: (!) 97.5 F (36.4 C)  SpO2: 100%   KPS: 90. General: Alert, cooperative, pleasant, in no acute distress Head: Normal EENT: No conjunctival injection or scleral icterus.  Lungs: Resp effort normal Cardiac: Regular rate Abdomen: Non-distended abdomen Skin: No rashes cyanosis or petechiae. Extremities: No clubbing or edema  Neurologic Exam: Mental Status: Awake, alert, attentive to examiner. Oriented to self and environment. Language is fluent with intact comprehension.  Cranial Nerves: Visual acuity is grossly normal. Visual fields are full. Extra-ocular movements intact. No ptosis. Face is  symmetric Motor: Tone and bulk are normal. Power is full in both arms and legs. Reflexes are symmetric, no pathologic reflexes present.  Sensory: Intact to light touch Gait: Normal.   Labs: I have reviewed the data as listed    Component Value Date/Time   NA 138 04/19/2023 0532   K 4.1 04/19/2023 0532   CL 104 04/19/2023 0532   CO2 26 04/19/2023 0532   GLUCOSE 129 (H) 04/19/2023 0532   BUN 11 04/19/2023 0532   CREATININE 0.83 04/19/2023 0532   CALCIUM 8.4 (L) 04/19/2023 0532   PROT 7.3 04/11/2023 1000   ALBUMIN 4.5 04/11/2023 1000   AST 20 04/11/2023 1000   ALT 26 04/11/2023 1000   ALKPHOS 38 04/11/2023 1000   BILITOT 0.7 04/11/2023 1000   GFRNONAA >60 04/19/2023 0532   GFRAA >60 02/18/2018 1204   Lab Results  Component Value Date   WBC 28.0 (H) 04/18/2023   NEUTROABS 8.3 (H) 04/11/2023   HGB 11.2 (L) 04/18/2023   HCT 32.3 (L) 04/18/2023   MCV 87.5 04/18/2023   PLT 210 04/18/2023    Imaging: [facility] Clinician Interpretation:  I have personally reviewed the CNS images as listed.  My interpretation, in the context of the patient's clinical presentation, is treatment effect vs true progression  MR BRAIN W WO CONTRAST Result Date: 09/20/2023 CLINICAL DATA:  Headache and dizziness.  History of meningioma. EXAM: MRI HEAD WITHOUT AND WITH CONTRAST TECHNIQUE: Multiplanar, multiecho pulse sequences of the brain and surrounding structures were obtained without and with intravenous contrast. CONTRAST:  7.5 cc Vueway IV COMPARISON:  04/19/2023 FINDINGS: Brain: Residual meningioma especially along the midline aspect the craniotomy site invading and expanding the superior sagittal sinus which is diffusely narrowed and nearly occlusive at the level of the torcula. Residual mass at this level measures up to 16mm in thickness. Contiguous dural thickening along the right parietal convexity is progressed, 7 mm in thickness and 2.6 cm in diameter. No significant mass effect on the brain; mild T2  hyperintensity scattered in the cortex underlying the craniotomy site is likely encephalomalacia. Vascular: Superior sagittal sinus invasion and narrowing as described. Skull and upper cervical spine: Right posterior craniotomy site is unremarkable. Sinuses/Orbits: Negative IMPRESSION: Known residual meningioma at the superior sagittal sinus measures similar to 04/19/2023. There is greater adjacent dural thickening extending rightward underneath the craniotomy flap, favor progressive plaque-like meningioma although some of this change may be related to evolution of the immediate postoperative changes on prior. Electronically Signed   By: Tiburcio Pea M.D.   On: 09/20/2023 19:25    Pathology:  SURGICAL PATHOLOGY * THIS IS AN ADDENDUM REPORT * CASE: [id number] PATIENT: Tillman Abide Surgical Pathology Report *Addendum *  Reason for Addendum #1:  Immunohistochemistry results  Clinical History: cerebral meningioma (cm)  FINAL MICROSCOPIC DIAGNOSIS:  A. RIGHT PARASAGITTAL DURAL BASED TUMOR, RESECTION:      Meningioma, meningothelial type, WHO grade 1, with focal necrosis.      No atypical features identified.      Surgical margin of the resection cannot be evaluated.  B. RIGHT PARASAGITTAL DURAL BASED TUMOR, RESECTION:      Meningioma, meningothelial type, WHO grade 1.      No atypical features identified.      Surgical margin of the resection cannot be evaluated.  COMMENT:  The tumor is a meningothelial meningioma histologically. There is focal necrosis on part A favoring ischemic necrosis. There is no histological evidence of brain invasion, sheet-like growth or increased cellularity. There are no prominent nucleoli or small cells with a high nuclear to cytoplasmic ratio. Mitotic figures are not identified (0/10 high power fields). No brain is present for evaluation of brain invasion. There is no surgical margin designated for which the margin status cannot be evaluated.  The  case was peer-reviewed by Dr. Luisa Hart who agrees with the diagnosis.     Assessment/Plan [facility]oma [facility])  We appreciate the opportunity to participate in the care of Tillman Abide.  He is clinically stable today, despite some persistence or recurrence of non-localizing symptoms.  MRI brain demonstrates stability of residual mass, with increased post-contrast signal extending along the dural margins.  This was reviewed in detail in CNS tumor board this morning; radiology feels this could still represent post-surgical dural reactivity rather than tumor infiltration.    Recommended repeating MRI brain in 4 months and re-reviewing in tumor board.  He is agreeable with this.    Will con't to follow with Dr. Wynetta Emery, as well as Dr. Everlena Cooper.  Agree with Keppra 500mg  BID for now.  Screening for potential clinical trials was performed and discussed using eligibility criteria for  active protocols at [facility], loco-regional tertiary centers, as well as national database available on GroundTransfer.at.    The patient is not a candidate for a research protocol at this time due to no suitable study identified.   We spent twenty additional minutes teaching regarding the natural history, biology, and historical experience in the treatment of brain tumors. We then discussed in detail the current recommendations for therapy focusing on the mode of administration, mechanism of action, anticipated toxicities, and quality of life issues associated with this plan. We also provided teaching sheets for the patient to take home as an additional resource.  All questions were answered. The patient knows to call the clinic with any problems, questions or concerns. No barriers to learning were detected.  The total time spent in the encounter was 60 minutes and more than 50% was on counseling and review of test results   Henreitta Leber, MD Medical Director of Neuro-Oncology [facility] at [place] 09/24/23 8:57 AM

## 2023-09-25 ENCOUNTER — Telehealth: Payer: Self-pay | Admitting: Internal Medicine

## 2023-09-25 NOTE — Telephone Encounter (Signed)
 Marland Kitchen

## 2023-09-27 ENCOUNTER — Other Ambulatory Visit: Payer: Self-pay | Admitting: Radiation Therapy

## 2023-10-08 NOTE — Progress Notes (Unsigned)
 NEUROLOGY FOLLOW UP OFFICE NOTE  Glenn Hoffman [id number]  Assessment/Plan:   Right parietal meningioma status post resection Probable isolated seizure, likely secondary to residual meningioma. Daily headache, residual from surgery vs tension-type    Defers starting a headache preventative at this time as they are mild and tolerable.  Consider starting topiramate5mg  at bedtime if needed. Remain on Keppra 500mg  twice daily Follow up MRI as per neuro-onc Discussed [place] law stating that he should not drive for 6 months after experiencing seizure. Follow up 4 months He is going out of the country next month.  He will have been stable on Keppra for 6 months by then, so I have no objection to traveling.       Subjective:   Glenn Hoffman is a 33 year old male who follows up for cerebral meningioma and possible recent seizure. MRI of brain from 09/20/2023 personally reviewed.  History supplemented by neuro-oncology note.  UPDATE: Current medications:  Keppra 500mg  twice daily  On 1/12, he had a recurrence of dizziness, unsteadiness and pressure behind his eyes.  Noted swashing water movement in head and right aural fullness, but not the severe splitting headache.  MRI of brain with and without contrast on 2/13 revealed stable residual meningioma with adjacent dural thickening extending rightward underneath the craniotomy flap suggestive of progressive plaque-like meningioma vs postoperative changes.  Discussed at tumor board with Dr. Barbaraann Cao and recommended repeating brain MRI in 4 months (scheduled for June).  In the longterm, it is suspected that he will eventually need to have the meningioma treated with radiology.    Continues to have headache but no longer more severe - dull headache (2-3/10) in the mornings and evenings.  Wakes up with fog/"hangover" feeling.  Once he is up, it clears up.  After working during the day, it subsides but by the evening, feels the right sided head  pressure/aural fullness and fatigue.  It is daily.  Doesn't typically treat with any analgesics.     HISTORY: Symptoms started in March 2024.  No preceding illness, head trauma, change in environment or new/worsening anxiety.  He has had a persistent headache.  Wakes up with dull headache (2/10) and gradually increases later in the day (no higher than 6/10).  He describes a pressure behind both eyes or across the forehead but it may lateralize to either side.  Exacerbated in severity and becomes throbbing with quick head movements or change in position, such as turning head, bending over or straightening up from a flexed position.  During this change in position, he may have associated blurred vision, sometimes actual horizontal or vertical diplopia, or dizziness (not spinning but rather an undulating sensation in his head).  Lasts just a few seconds.  When he moves his eyes, he can feel a tugging sensation behind the eye with pressure in the adjacent temple.  He also endorses aural fullness and has not sometimes pop his ears.  Occasional ringing in the ears.  Some photosenstivity which is not new.  No nausea, phonophobia, rhinorrhea, congestion, neck pain, jaw pain, numbness or weakness.  Treats with ibuprofen infrequently.  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  He went away on a trip to [place] in April and symptoms had resolved.  But they returned once he came back home.  Head pressure is similar to symptoms he experienced when he had COVID back in early 2021.  No prior history of headaches.  About every 18 months or so, he has had episodes  of dizziness and blurred vision but would last no longer than 2 weeks.  MRI of brain with and without contrast on 02/28/2023 showed a 6.4 cm right parietal extra-axial meningioma with superior sagittal sinus invasion without brain edema.  Referred to neurosurgery.  Repeat MRI of brain with and without contrast performed on 03/20/2023 along with MRV of head, which revealed stable  right parietal meningioma invading and causing severe stenosis of the superior sagittal sinus with moderate mass effect on the underlying parenchyma and mild mass effect on the right lateral ventricle but no hydrocephalus or shift.  He underwent craniotomy with resection of the meningioma on 04/17/2023 with right middle meningeal artery embolization. There were no complications and he did well immediately afterwards.  He was on a course of steroids and felt well.  After finishing the steroids, he had slight return of headache and dizziness, but that resolved and he hasn't had any more headaches.  In early October, he developed subgaleal effusions involving his eyes, face and head.  They subsequently resolved.  On 05/31/2023, he wasn't feeling well.  He took a nap.  When he woke up 2 hours later, he had severe right shoulder pain.  He got up and went to the bathroom and noticed a broken soap dish in the sink.  He went to urgent care and was found to have dislocated his shoulder.  Unsure how that happened but concern for possible seizure.  His wife was at work and therefore there were no witnesses.  No diffuse muscle soreness, incontinence or tongue/cheek laceration.  Soon after this incident, he developed another subgaleal effusion.  His neurosurgeon, Dr. Wynetta Emery, started him on Keppra. Awake and drowsy routine EEG on 06/12/2023 was normal.  He reportedly had a new MRI of the brain a couple of weeks ago (not in system).  Reportedly showed no change compared to the previous one in September.    PAST MEDICAL HISTORY: Past Medical History:  Diagnosis Date   ADHD    Anxiety    Depression     MEDICATIONS: Current Outpatient Medications on File Prior to Visit  Medication Sig Dispense Refill   escitalopram (LEXAPRO) 10 MG tablet Take 10 mg by mouth daily.     ibuprofen (ADVIL) 200 MG tablet Take 200 mg by mouth as needed for moderate pain (pain score 4-6) or headache.     levETIRAcetam (KEPPRA) 500 MG tablet Take  500 mg by mouth 2 (two) times daily.     OVER THE COUNTER MEDICATION Take 15 mg by mouth 2 (two) times daily as needed (anxiety). THC gummy 15 mg     No current facility-administered medications on file prior to visit.    ALLERGIES: No Known Allergies  FAMILY HISTORY: Family History  Problem Relation Age of Onset   Stroke Paternal Grandfather    Hemophilia Maternal Great-grandmother       Objective:  Blood pressure 109/69, pulse 70, height 6\' 1"  (1.854 m), weight 173 lb (78.5 kg), SpO2 95%. General: No acute distress.  Patient appears well-groomed.   Head:  Normocephalic/atraumatic Head:  Normocephalic/atraumatic Neck:  Supple.  No paraspinal tenderness.  Full range of motion. Heart:  Regular rate and rhythm. Neuro:  Alert and oriented.  Speech fluent and not dysarthric.  Language intact.  CN II-XII intact.  Bulk and tone normal.  Muscle strength 5/5 throughout.  Deep tendon reflexes 2+ throughout, toes down-going.  Gait normal.  Romberg negative.    Shon Millet, DO  CC: Guerry Bruin, MD

## 2023-10-10 ENCOUNTER — Ambulatory Visit: Payer: Managed Care, Other (non HMO) | Admitting: Neurology

## 2023-10-10 ENCOUNTER — Encounter: Payer: Self-pay | Admitting: Neurology

## 2023-10-10 VITALS — BP 109/69 | HR 70 | Ht 73.0 in | Wt 173.0 lb

## 2023-10-10 DIAGNOSIS — R519 Headache, unspecified: Secondary | ICD-10-CM | POA: Diagnosis not present

## 2023-10-10 DIAGNOSIS — R569 Unspecified convulsions: Secondary | ICD-10-CM | POA: Diagnosis not present

## 2023-10-10 DIAGNOSIS — Z86018 Personal history of other benign neoplasm: Secondary | ICD-10-CM | POA: Diagnosis not present

## 2023-10-10 DIAGNOSIS — Z9889 Other specified postprocedural states: Secondary | ICD-10-CM

## 2023-10-24 ENCOUNTER — Other Ambulatory Visit: Payer: Self-pay

## 2023-10-24 MED ORDER — ESCITALOPRAM OXALATE 10 MG PO TABS
10.0000 mg | ORAL_TABLET | Freq: Every day | ORAL | 3 refills | Status: AC
  Filled 2023-10-24 (×2): qty 90, 90d supply, fill #0
  Filled 2024-01-21: qty 90, 90d supply, fill #1
  Filled 2024-05-14: qty 90, 90d supply, fill #2
  Filled 2024-06-17: qty 90, 90d supply, fill #3

## 2023-10-24 MED ORDER — LEVETIRACETAM 500 MG PO TABS
500.0000 mg | ORAL_TABLET | Freq: Two times a day (BID) | ORAL | 0 refills | Status: DC
  Filled 2023-10-24: qty 60, 30d supply, fill #0

## 2023-12-05 ENCOUNTER — Other Ambulatory Visit: Payer: Self-pay | Admitting: Neurology

## 2023-12-05 ENCOUNTER — Other Ambulatory Visit: Payer: Self-pay

## 2023-12-05 MED ORDER — LEVETIRACETAM 500 MG PO TABS
500.0000 mg | ORAL_TABLET | Freq: Two times a day (BID) | ORAL | 0 refills | Status: DC
  Filled 2023-12-05: qty 60, 30d supply, fill #0

## 2023-12-06 ENCOUNTER — Other Ambulatory Visit: Payer: Self-pay

## 2024-01-17 ENCOUNTER — Ambulatory Visit
Admission: RE | Admit: 2024-01-17 | Discharge: 2024-01-17 | Disposition: A | Discharge status: Home / Self Care | Payer: Managed Care, Other (non HMO) | Source: Ambulatory Visit | Attending: Internal Medicine | Admitting: Internal Medicine

## 2024-01-17 DIAGNOSIS — D329 Benign neoplasm of meninges, unspecified: Secondary | ICD-10-CM

## 2024-01-17 MED ORDER — GADOPICLENOL 0.5 MMOL/ML IV SOLN
8.0000 mL | Freq: Once | INTRAVENOUS | Status: AC | PRN
  Administered 2024-01-17: 8 mL via INTRAVENOUS

## 2024-01-21 ENCOUNTER — Inpatient Hospital Stay: Payer: Managed Care, Other (non HMO) | Attending: Internal Medicine | Admitting: Internal Medicine

## 2024-01-21 ENCOUNTER — Other Ambulatory Visit: Payer: Self-pay

## 2024-01-21 VITALS — BP 111/68 | HR 60 | Temp 99.7°F | Resp 16 | Ht 73.0 in | Wt 176.6 lb

## 2024-01-21 DIAGNOSIS — D329 Benign neoplasm of meninges, unspecified: Secondary | ICD-10-CM | POA: Diagnosis not present

## 2024-01-21 DIAGNOSIS — R519 Headache, unspecified: Secondary | ICD-10-CM | POA: Insufficient documentation

## 2024-01-21 DIAGNOSIS — Z79899 Other long term (current) drug therapy: Secondary | ICD-10-CM | POA: Diagnosis not present

## 2024-01-21 DIAGNOSIS — F1090 Alcohol use, unspecified, uncomplicated: Secondary | ICD-10-CM | POA: Diagnosis not present

## 2024-01-21 DIAGNOSIS — D320 Benign neoplasm of cerebral meninges: Secondary | ICD-10-CM | POA: Diagnosis present

## 2024-01-21 MED ORDER — LEVETIRACETAM 250 MG PO TABS
250.0000 mg | ORAL_TABLET | Freq: Two times a day (BID) | ORAL | 5 refills | Status: AC
  Filled 2024-01-21: qty 60, 30d supply, fill #0
  Filled 2024-02-11 (×2): qty 60, 30d supply, fill #1
  Filled 2024-03-24: qty 60, 30d supply, fill #2
  Filled 2024-05-14: qty 60, 30d supply, fill #3
  Filled 2024-06-17: qty 60, 30d supply, fill #4
  Filled 2024-08-22: qty 60, 30d supply, fill #5

## 2024-01-21 NOTE — Progress Notes (Signed)
 [facility] [address]  [place], [place] [address] [phone]   Interval Evaluation  Date of Service: 01/21/24 Patient Name: Glenn Hoffman Patient MRN: [id number] Patient DOB: 1991/01/23 Provider: Mamie Searles, MD  Identifying Statement:  Rodgerick Gilliand is a 33 y.o. male with left parietal meningioma   Oncologic History 04/18/23: Left parietal mass resected by Dr. Lamon Pillow; path is WHO grade 1 meningioma  Interval History: [name] presents today for follow up, after recent MRI brain.  No new or progressive neurologic symptoms.  Continues to have sporadic headaches, sees Dr. Festus Hubert.  No seizures, continues on Keppra  500mg  twice per day, although he does describe notable fatigue symptoms from the drug.  H+P (09/24/23) Patient presents today for evaluation following recent MRI brain.  He initially underwent craniotomy, resection of left parietal meningioma with Dr. Lamon Pillow in September 2024, preceded by multi-vessel embolization with Dr. Nat Badger.  Initial symptoms were headaches, imbalance, which mostly improved after surgery.  He still has some tension headaches, mild subjective imbalance, usually more prominent in the morning.  He did have an episode concerning for focal seizure in October; he woke up from a nap in a confused state with a dislocated shoulder.  Has been dosing keppra  500mg  twice per day since that time without issue.  Medications: Current Outpatient Medications on File Prior to Visit  Medication Sig Dispense Refill   escitalopram  (LEXAPRO ) 10 MG tablet Take 10 mg by mouth daily.     escitalopram  (LEXAPRO ) 10 MG tablet Take 1 tablet (10 mg total) by mouth daily. 90 tablet 3   ibuprofen  (ADVIL ) 200 MG tablet Take 200 mg by mouth as needed for moderate pain (pain score 4-6) or headache.     levETIRAcetam  (KEPPRA ) 500 MG tablet Take 500 mg by mouth 2 (two) times daily.     levETIRAcetam  (KEPPRA ) 500 MG tablet Take 1 tablet (500 mg  total) by mouth 2 (two) times daily. 60 tablet 0   OVER THE COUNTER MEDICATION Take 15 mg by mouth 2 (two) times daily as needed (anxiety). THC gummy 15 mg     No current facility-administered medications on file prior to visit.    Allergies: No Known Allergies Past Medical History:  Past Medical History:  Diagnosis Date   ADHD    Anxiety    Depression    Past Surgical History:  Past Surgical History:  Procedure Laterality Date   APPLICATION OF CRANIAL NAVIGATION N/A 04/18/2023   Procedure: APPLICATION OF CRANIAL NAVIGATION;  Surgeon: Gearl Keens, MD;  Location: [facility] OR;  Service: Neurosurgery;  Laterality: N/A;   CRANIOTOMY Right 04/18/2023   Procedure: OCCIPITAL PARIETAL CRANI FOR RESECTION OF PARASAGITTAL CONVEXITY MENINGIOMA;  Surgeon: Gearl Keens, MD;  Location: [facility] OR;  Service: Neurosurgery;  Laterality: Right;   IR ANGIO EXTERNAL CAROTID SEL EXT CAROTID UNI R MOD SED  04/17/2023   IR ANGIO INTRA EXTRACRAN SEL INTERNAL CAROTID UNI R MOD SED  04/17/2023   IR ANGIOGRAM FOLLOW UP STUDY  04/17/2023   IR NEURO EACH ADD'L AFTER BASIC UNI RIGHT (MS)  04/17/2023   IR TRANSCATH/EMBOLIZ  04/17/2023   RADIOLOGY WITH ANESTHESIA N/A 04/17/2023   Procedure: Right middle meningeal artery embolization;  Surgeon: Augusto Blonder, MD;  Location: [facility] OR;  Service: Radiology;  Laterality: N/A;   SKIN BIOPSY Right    Right knee- in derm office   Social History:  Social History   Socioeconomic History   Marital status: Legally Separated  Spouse name: Not on file   Number of children: Not on file   Years of education: Not on file   Highest education level: Not on file  Occupational History   Not on file  Tobacco Use   Smoking status: Never   Smokeless tobacco: Never  Vaping Use   Vaping status: Some Days   Substances: THC  Substance and Sexual Activity   Alcohol use: Yes    Alcohol/week: 7.0 standard drinks of alcohol    Types: 7 Cans of beer per week    Comment: 1-2 beer or wine per day    Drug use: Yes    Types: Marijuana   Sexual activity: Not on file  Other Topics Concern   Not on file  Social History Narrative   Right hsnddedp   Social Drivers of Health   Financial Resource Strain: Not on file  Food Insecurity: No Food Insecurity (09/24/2023)   Hunger Vital Sign    Worried About Running Out of Food in the Last Year: Never true    Ran Out of Food in the Last Year: Never true  Transportation Needs: No Transportation Needs (09/24/2023)   PRAPARE - Administrator, Civil Service (Medical): No    Lack of Transportation (Non-Medical): No  Physical Activity: Not on file  Stress: Not on file  Social Connections: Not on file  Intimate Partner Violence: Not At Risk (09/24/2023)   Humiliation, Afraid, Rape, and Kick questionnaire    Fear of Current or Ex-Partner: No    Emotionally Abused: No    Physically Abused: No    Sexually Abused: No   Family History:  Family History  Problem Relation Age of Onset   Stroke Paternal Grandfather    Hemophilia Maternal Great-grandmother     Review of Systems: Constitutional: Doesn't report fevers, chills or abnormal weight loss Eyes: Doesn't report blurriness of vision Ears, nose, mouth, throat, and face: Doesn't report sore throat Respiratory: Doesn't report cough, dyspnea or wheezes Cardiovascular: Doesn't report palpitation, chest discomfort  Gastrointestinal:  Doesn't report nausea, constipation, diarrhea GU: Doesn't report incontinence Skin: Doesn't report skin rashes Neurological: Per HPI Musculoskeletal: Doesn't report joint pain Behavioral/Psych: Doesn't report anxiety  Physical Exam: Vitals:   01/21/24 0931  BP: 111/68  Pulse: 60  Resp: 16  Temp: 99.7 F (37.6 C)  SpO2: 98%   KPS: 90. General: Alert, cooperative, pleasant, in no acute distress Head: Normal EENT: No conjunctival injection or scleral icterus.  Lungs: Resp effort normal Cardiac: Regular rate Abdomen: Non-distended  abdomen Skin: No rashes cyanosis or petechiae. Extremities: No clubbing or edema  Neurologic Exam: Mental Status: Awake, alert, attentive to examiner. Oriented to self and environment. Language is fluent with intact comprehension.  Cranial Nerves: Visual acuity is grossly normal. Visual fields are full. Extra-ocular movements intact. No ptosis. Face is symmetric Motor: Tone and bulk are normal. Power is full in both arms and legs. Reflexes are symmetric, no pathologic reflexes present.  Sensory: Intact to light touch Gait: Normal.   Labs: I have reviewed the data as listed    Component Value Date/Time   NA 138 04/19/2023 0532   K 4.1 04/19/2023 0532   CL 104 04/19/2023 0532   CO2 26 04/19/2023 0532   GLUCOSE 129 (H) 04/19/2023 0532   BUN 11 04/19/2023 0532   CREATININE 0.83 04/19/2023 0532   CALCIUM 8.4 (L) 04/19/2023 0532   PROT 7.3 04/11/2023 1000   ALBUMIN  4.5 04/11/2023 1000   AST 20 04/11/2023  1000   ALT 26 04/11/2023 1000   ALKPHOS 38 04/11/2023 1000   BILITOT 0.7 04/11/2023 1000   GFRNONAA >60 04/19/2023 0532   GFRAA >60 02/18/2018 1204   Lab Results  Component Value Date   WBC 28.0 (H) 04/18/2023   NEUTROABS 8.3 (H) 04/11/2023   HGB 11.2 (L) 04/18/2023   HCT 32.3 (L) 04/18/2023   MCV 87.5 04/18/2023   PLT 210 04/18/2023    Imaging: [facility] Clinician Interpretation: I have personally reviewed the CNS images as listed.  My interpretation, in the context of the patient's clinical presentation, is stable disease  MR BRAIN W WO CONTRAST Result Date: 01/17/2024 CLINICAL DATA:  Brain/CNS neoplasm, assess treatment response. Dizziness and headaches. History of meningioma resection. EXAM: MRI HEAD WITHOUT AND WITH CONTRAST TECHNIQUE: Multiplanar, multiecho pulse sequences of the brain and surrounding structures were obtained without and with intravenous contrast. CONTRAST:  8 mL Vueway  COMPARISON:  Head MRI 09/20/2023 FINDINGS: Brain: Residual meningioma invading and  expanding the mid to posterior aspect of the superior sagittal sinus has not significantly changed, measuring 1.7 x 1.7 cm on axial images (series 13, image 115). More plaque-like dural thickening and enhancement extending laterally over the posterior right cerebral convexity subjacent to the craniotomy is also unchanged and could reflect tumor, postoperative dural thickening, or a combination. Several small areas of cortical T2 hyperintensity subjacent to the craniotomy in the posterior right cerebral hemisphere are unchanged and may reflect encephalomalacia. A small amount of regional chronic blood products are noted. No acute infarct, midline shift, or significant extra-axial fluid collection is identified. The ventricles are normal in size. Vascular: Major intracranial arterial flow voids are preserved. There is unchanged severe, near occlusive narrowing of the superior sagittal sinus posteriorly by tumor. Skull and upper cervical spine: Right parietal craniotomy. Sinuses/Orbits: Unremarkable orbits. Paranasal sinuses and mastoid air cells are clear. Other: None. IMPRESSION: Unchanged residual meningioma involving the superior sagittal sinus. Electronically Signed   By: Aundra Lee M.D.   On: 01/17/2024 12:36     Assessment/Plan Meningioma (HCC)  Khristian Phillippi is clinically stable today.  MRI brain demonstrates stability of residual mass, with additional stability of post-contrast signal extending along the dural margins.  This is encouraging and supportive of post-surgical etiology.  Recommended continuing imaging surveillance.  Will con't to follow with Dr. Lamon Pillow, as well as Dr. Festus Hubert.  Agree with lowering Keppra  to 250mg  BID if interested due to fatigue effects, long term control.    We ask that Valery Chance return to clinic in 6 months following next brain MRI, or sooner as needed.  All questions were answered. The patient knows to call the clinic with any problems, questions or concerns.  No barriers to learning were detected.  The total time spent in the encounter was 40 minutes and more than 50% was on counseling and review of test results   Mamie Searles, MD Medical Director of Neuro-Oncology [facility] 01/21/24 9:32 AM

## 2024-01-22 ENCOUNTER — Other Ambulatory Visit: Payer: Self-pay

## 2024-02-06 ENCOUNTER — Telehealth: Payer: Self-pay | Admitting: Internal Medicine

## 2024-02-06 NOTE — Telephone Encounter (Signed)
 Scheduled appointment per 6/18 los. Talked with the patient and he is aware of the made appointment.

## 2024-02-07 NOTE — Progress Notes (Deleted)
 NEUROLOGY FOLLOW UP OFFICE NOTE  Erick Murin [id number]  Assessment/Plan:   Right parietal meningioma status post resection Probable isolated seizure, likely secondary to residual meningioma. Daily headache, residual from surgery vs tension-type    Defers starting a headache preventative at this time as they are mild and tolerable.  Consider starting topiramate 5mg  at bedtime if needed. Keppra  500mg  twice daily for seizure prophylaxis Follow up MRI as per neuro-onc ***     Subjective:  Tamim Skog is a 33 year old male who follows up for cerebral meningioma and possible recent seizure. MRI of brain from 01/17/2024 personally reviewed.  UPDATE: Current medications:  Keppra  500mg  twice daily  Repeat MRI of brain with and without contrast on 01/17/2024 revealed unchanged residual meningioma involving the superior sagittal sinus.  Neuro-onc recommends continued imaging surveillance for now.  Repeat MRI is scheduled in December.  Continues to have headache but no longer more severe - dull headache (2-3/10) in the mornings and evenings.  Wakes up with fog/hangover feeling.  Once he is up, it clears up.  After working during the day, it subsides but by the evening, feels the right sided head pressure/aural fullness and fatigue.  It is daily.  Doesn't typically treat with any analgesics.     HISTORY: Symptoms started in March 2024.  No preceding illness, head trauma, change in environment or new/worsening anxiety.  He has had a persistent headache.  Wakes up with dull headache (2/10) and gradually increases later in the day (no higher than 6/10).  He describes a pressure behind both eyes or across the forehead but it may lateralize to either side.  Exacerbated in severity and becomes throbbing with quick head movements or change in position, such as turning head, bending over or straightening up from a flexed position.  During this change in position, he may have associated blurred  vision, sometimes actual horizontal or vertical diplopia, or dizziness (not spinning but rather an undulating sensation in his head).  Lasts just a few seconds.  When he moves his eyes, he can feel a tugging sensation behind the eye with pressure in the adjacent temple.  He also endorses aural fullness and has not sometimes pop his ears.  Occasional ringing in the ears.  Some photosenstivity which is not new.  No nausea, phonophobia, rhinorrhea, congestion, neck pain, jaw pain, numbness or weakness.  Treats with ibuprofen  infrequently.  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  He went away on a trip to [place] in April and symptoms had resolved.  But they returned once he came back home.  Head pressure is similar to symptoms he experienced when he had COVID back in early 2021.  No prior history of headaches.  About every 18 months or so, he has had episodes of dizziness and blurred vision but would last no longer than 2 weeks.  MRI of brain with and without contrast on 02/28/2023 showed a 6.4 cm right parietal extra-axial meningioma with superior sagittal sinus invasion without brain edema.  Referred to neurosurgery.  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  He underwent craniotomy with resection of the meningioma on 04/17/2023 with right middle meningeal artery embolization. There were no complications and he did well  immediately afterwards.  He was on a course of steroids and felt well.  After finishing the steroids, he had slight return of headache and dizziness, but that resolved and he hasn't had any more headaches.  In early October, he developed subgaleal effusions involving his eyes, face and head.  They subsequently  resolved.  On 05/31/2023, he wasn't feeling well.  He took a nap.  When he woke up 2 hours later, he had severe right shoulder pain.  He got up and went to the bathroom and noticed a broken soap dish in the sink.  He went to urgent care and was found to have dislocated his shoulder.  Unsure how that happened but concern for possible seizure.  His wife was at work and therefore there were no witnesses.  No diffuse muscle soreness, incontinence or tongue/cheek laceration.  Soon after this incident, he developed another subgaleal effusion.  His neurosurgeon, Dr. Onetha, started him on Keppra . Awake and drowsy routine EEG on 06/12/2023 was normal.  He reportedly had a new MRI of the brain later that month (not in system).  Reportedly showed no change compared to the previous one in September 2024.    On 08/19/2023, he had a recurrence of dizziness, unsteadiness and pressure behind his eyes.  Noted swashing water movement in head and right aural fullness, but not the severe splitting headache.  MRI of brain with and without contrast on 2/13 revealed stable residual meningioma with adjacent dural thickening extending rightward underneath the craniotomy flap suggestive of progressive plaque-like meningioma vs postoperative changes.  Discussed at tumor board with Dr. Buckley and recommended continued monitoring with imaging but, in the longterm, it is suspected that he will eventually need to have the meningioma treated with radiology.    PAST MEDICAL HISTORY: Past Medical History:  Diagnosis Date   ADHD    Anxiety    Depression     MEDICATIONS: Current Outpatient Medications on File Prior to Visit  Medication Sig Dispense Refill   escitalopram  (LEXAPRO ) 10 MG tablet Take 10 mg by mouth daily.     escitalopram  (LEXAPRO ) 10 MG tablet Take 1 tablet (10 mg total) by mouth daily. 90 tablet 3   ibuprofen  (ADVIL ) 200 MG tablet Take 200 mg by mouth as needed for moderate pain (pain score 4-6) or headache.      levETIRAcetam  (KEPPRA ) 250 MG tablet Take 1 tablet (250 mg total) by mouth 2 (two) times daily. 60 tablet 5   OVER THE COUNTER MEDICATION Take 15 mg by mouth 2 (two) times daily as needed (anxiety). THC gummy 15 mg     No current facility-administered medications on file prior to visit.    ALLERGIES: No Known Allergies  FAMILY HISTORY: Family History  Problem Relation Age of Onset   Stroke Paternal Grandfather    Hemophilia Maternal Great-grandmother       Objective:  *** General: No acute distress.  Patient appears well-groomed.   Head:  Normocephalic/atraumatic Head:  Normocephalic/atraumatic Neck:  Supple.  No paraspinal tenderness.  Full range of motion. Heart:  Regular rate and rhythm. Neuro:  Alert and oriented.  Speech fluent and not dysarthric.  Language intact.  CN II-XII intact.  Bulk and tone normal.  Muscle strength 5/5 throughout.  Deep tendon reflexes 2+ throughout, toes down-going.  Gait normal.  Romberg negative.    Juliene Dunnings, DO  CC: Richard Tisovec, MD

## 2024-02-11 ENCOUNTER — Other Ambulatory Visit: Payer: Self-pay

## 2024-02-11 ENCOUNTER — Ambulatory Visit: Admitting: Neurology

## 2024-02-13 ENCOUNTER — Other Ambulatory Visit: Payer: Self-pay

## 2024-02-18 ENCOUNTER — Encounter

## 2024-03-21 NOTE — Progress Notes (Unsigned)
 NEUROLOGY FOLLOW UP OFFICE NOTE  Glenn Hoffman [id number]  Assessment/Plan:   Residual right parietal meningioma involving superior sagittal sinus status post resection, stable Probable isolated seizure, likely secondary to residual meningioma. Daily headache, residual from surgery vs tension-type    Remain on Keppra 250mg  twice daily Meningioma imaging surveillance as per neuro-oncology Follow up 6 months      Subjective:  Glenn Hoffman is a 33 year old male who follows up for cerebral meningioma and possible recent seizure. MRI of brain from 09/20/2023 personally reviewed.  History supplemented by neuro-oncology note.  MRI of brain personally reviewed.  UPDATE: Current medications:  Keppra 250mg  twice daily   Repeat MRI of brain with and without contrast on 01/17/2024 showed stable residual 1.7 x 1.7 cm meningioma involving the superior sagittal sinus.  Neuro-onc reduced dose of Keppra to 250mg  twice daily to see if fatigue was a side effect.  He notes some improvement.  He is active, camping and hiking.  He went to [place] this summer without problems.    Headaches now occur in the evening now (not waking up with them).  A left temporal/retro-orbital pulsating ache, but not as significant and not with dizziness.  These headaches are 3/10 at most (pre-surgery they were 8-9/10).  Occurs 3 to 4 nights a week.  If it is late enough, he will not take anything and just goes to bed.  If they do occur in the mid-day it is usually triggered by dehydration or work-related strenuous activity, that may happen 1 or 2 times a week.  Balance is good.     HISTORY: Symptoms started in March 2024.  No preceding illness, head trauma, change in environment or new/worsening anxiety.  He has had a persistent headache.  Wakes up with dull headache (2/10) and gradually increases later in the day (no higher than 6/10).  He describes a pressure behind both eyes or across the forehead but it may lateralize to  either side.  Exacerbated in severity and becomes throbbing with quick head movements or change in position, such as turning head, bending over or straightening up from a flexed position.  During this change in position, he may have associated blurred vision, sometimes actual horizontal or vertical diplopia, or dizziness (not spinning but rather an undulating sensation in his head).  Lasts just a few seconds.  When he moves his eyes, he can feel a tugging sensation behind the eye with pressure in the adjacent temple.  He also endorses aural fullness and has not sometimes pop his ears.  Occasional ringing in the ears.  Some photosenstivity which is not new.  No nausea, phonophobia, rhinorrhea, congestion, neck pain, jaw pain, numbness or weakness.  Treats with ibuprofen infrequently.  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  He went away on a trip to [place] in April and symptoms had resolved.  But they returned once he came back home.  Head pressure is similar to symptoms he experienced when he had COVID back in early 2021.  No prior history of headaches.  About every 18 months or so, he has had episodes of dizziness and blurred vision but would last no longer than 2 weeks.  MRI of brain with and without contrast on 02/28/2023 showed a 6.4 cm right parietal extra-axial meningioma with superior sagittal sinus invasion without brain edema.  Referred to neurosurgery.  Repeat MRI of brain with and without contrast performed on 03/20/2023 along with MRV of head, which revealed stable right parietal meningioma  invading and causing severe stenosis of the superior sagittal sinus with moderate mass effect on the underlying parenchyma and mild mass effect on the right lateral ventricle but no hydrocephalus or shift.  He underwent craniotomy with resection of the meningioma on 04/17/2023 with right middle meningeal artery embolization. There were no complications and he did well immediately afterwards.  He was on a course of steroids and  felt well.  After finishing the steroids, he had slight return of headache and dizziness, but that resolved and he hasn't had any more headaches.  In early October, he developed subgaleal effusions involving his eyes, face and head.  They subsequently resolved.  On 05/31/2023, he wasn't feeling well.  He took a nap.  When he woke up 2 hours later, he had severe right shoulder pain.  He got up and went to the bathroom and noticed a broken soap dish in the sink.  He went to urgent care and was found to have dislocated his shoulder.  Unsure how that happened but concern for possible seizure.  His wife was at work and therefore there were no witnesses.  No diffuse muscle soreness, incontinence or tongue/cheek laceration.  Soon after this incident, he developed another subgaleal effusion.  His neurosurgeon, Dr. Onetha, started him on Keppra. Awake and drowsy routine EEG on 06/12/2023 was normal.  He reportedly had a new MRI of the brain a couple of weeks ago (not in system).  Reportedly showed no change compared to the previous one in September.  On 08/19/2023, he had a recurrence of dizziness, unsteadiness and pressure behind his eyes.  Noted swashing water movement in head and right aural fullness, but not the severe splitting headache.  MRI of brain with and without contrast on 09/20/2023 revealed stable residual meningioma with adjacent dural thickening extending rightward underneath the craniotomy flap suggestive of progressive plaque-like meningioma vs postoperative changes.  Discussed at tumor board with Dr. Buckley and recommended continued MRI monitoring.  In the longterm, it is suspected that he will eventually need to have the meningioma treated with radiology.    PAST MEDICAL HISTORY: Past Medical History:  Diagnosis Date   ADHD    Anxiety    Depression     MEDICATIONS: Current Outpatient Medications on File Prior to Visit  Medication Sig Dispense Refill   escitalopram (LEXAPRO) 10 MG tablet Take 10 mg  by mouth daily.     escitalopram (LEXAPRO) 10 MG tablet Take 1 tablet (10 mg total) by mouth daily. 90 tablet 3   ibuprofen (ADVIL) 200 MG tablet Take 200 mg by mouth as needed for moderate pain (pain score 4-6) or headache.     levETIRAcetam (KEPPRA) 250 MG tablet Take 1 tablet (250 mg total) by mouth 2 (two) times daily. 60 tablet 5   OVER THE COUNTER MEDICATION Take 15 mg by mouth 2 (two) times daily as needed (anxiety). THC gummy 15 mg     No current facility-administered medications on file prior to visit.    ALLERGIES: No Known Allergies  FAMILY HISTORY: Family History  Problem Relation Age of Onset   Stroke Paternal Grandfather    Hemophilia Maternal Great-grandmother       Objective:  Blood pressure 103/62, pulse (!) 50, height 6' 1 (1.854 m), weight 173 lb (78.5 kg), SpO2 96%. General: No acute distress.  Patient appears well-groomed.   Head:  Normocephalic/atraumatic Neck:  Supple.  No paraspinal tenderness.  Full range of motion. Heart:  Regular rate and rhythm. Neuro:  Alert and oriented.  Speech fluent and not dysarthric.  Language intact.  CN II-XII intact.  Bulk and tone normal.  Muscle strength 5/5 throughout.  Sensation to light touch intact.  Deep tendon reflexes 2+ throughout, toes downgoing.  Gait normal.  Romberg negative.     Juliene Dunnings, DO  CC: Richard Tisovec, MD

## 2024-03-24 ENCOUNTER — Encounter: Payer: Self-pay | Admitting: Neurology

## 2024-03-24 ENCOUNTER — Other Ambulatory Visit: Payer: Self-pay

## 2024-03-24 ENCOUNTER — Ambulatory Visit: Admitting: Neurology

## 2024-03-24 VITALS — BP 103/62 | HR 50 | Ht 73.0 in | Wt 173.0 lb

## 2024-03-24 DIAGNOSIS — Z9889 Other specified postprocedural states: Secondary | ICD-10-CM | POA: Diagnosis not present

## 2024-03-24 DIAGNOSIS — Z86018 Personal history of other benign neoplasm: Secondary | ICD-10-CM

## 2024-03-24 DIAGNOSIS — R569 Unspecified convulsions: Secondary | ICD-10-CM

## 2024-03-24 NOTE — Patient Instructions (Signed)
F/u 6 months

## 2024-05-14 ENCOUNTER — Other Ambulatory Visit: Payer: Self-pay

## 2024-06-09 ENCOUNTER — Encounter: Payer: Self-pay | Admitting: Radiology

## 2024-06-17 ENCOUNTER — Other Ambulatory Visit: Payer: Self-pay

## 2024-06-24 ENCOUNTER — Other Ambulatory Visit: Payer: Self-pay

## 2024-07-14 ENCOUNTER — Ambulatory Visit (HOSPITAL_COMMUNITY): Admission: RE | Admit: 2024-07-14 | Discharge: 2024-07-14 | Attending: Internal Medicine

## 2024-07-14 DIAGNOSIS — D329 Benign neoplasm of meninges, unspecified: Secondary | ICD-10-CM

## 2024-07-14 MED ORDER — GADOBUTROL 1 MMOL/ML IV SOLN
8.0000 mL | Freq: Once | INTRAVENOUS | Status: AC | PRN
  Administered 2024-07-14: 8 mL via INTRAVENOUS

## 2024-07-21 ENCOUNTER — Telehealth: Payer: Self-pay

## 2024-07-21 ENCOUNTER — Encounter

## 2024-07-21 ENCOUNTER — Inpatient Hospital Stay: Attending: Internal Medicine | Admitting: Internal Medicine

## 2024-07-21 NOTE — Telephone Encounter (Signed)
 Attempted to call patient on primary number regarding today's appointment. No answer. Left voicemail providing patient with clinic number and requested for patient to return call to confirm appointment.

## 2024-07-24 ENCOUNTER — Telehealth: Payer: Self-pay | Admitting: Internal Medicine

## 2024-07-24 NOTE — Telephone Encounter (Signed)
 Rescheduled missed appointment. Called and spoke with the patient, he is aware.

## 2024-08-12 ENCOUNTER — Inpatient Hospital Stay: Attending: Internal Medicine | Admitting: Internal Medicine

## 2024-08-12 VITALS — BP 115/70 | HR 69 | Temp 97.9°F | Resp 20 | Wt 176.3 lb

## 2024-08-12 DIAGNOSIS — D320 Benign neoplasm of cerebral meninges: Secondary | ICD-10-CM | POA: Insufficient documentation

## 2024-08-12 DIAGNOSIS — Z79899 Other long term (current) drug therapy: Secondary | ICD-10-CM | POA: Diagnosis not present

## 2024-08-12 DIAGNOSIS — D329 Benign neoplasm of meninges, unspecified: Secondary | ICD-10-CM

## 2024-08-12 NOTE — Progress Notes (Signed)
 "  [facility] at [facility] [address]  [place], [place] [address] [phone]   Interval Evaluation  Date of Service: 08/12/2024 Patient Name: Glenn Hoffman Patient MRN: [id number] Patient DOB: 02-09-1991 Provider: Arthea MARLA Manns, MD  Identifying Statement:  Glenn Hoffman is a 34 y.o. male with left parietal meningioma   Oncologic History 04/18/23: Left parietal mass resected by Dr. Onetha; path is WHO grade 1 meningioma  Interval History: Coda Mathey presents today for follow up, after recent MRI brain.  No clinical changes reported.  Headaches have mostly resolved.  No seizures, continues on Keppra  250mg  twice per day.  Still describes some issues with fatigue.  H+P (09/24/23) Patient presents today for evaluation following recent MRI brain.  He initially underwent craniotomy, resection of left parietal meningioma with Dr. Onetha in September 2024, preceded by multi-vessel embolization with Dr. Lanis.  Initial symptoms were headaches, imbalance, which mostly improved after surgery.  He still has some tension headaches, mild subjective imbalance, usually more prominent in the morning.  He did have an episode concerning for focal seizure in October; he woke up from a nap in a confused state with a dislocated shoulder.  Has been dosing keppra  500mg  twice per day since that time without issue.  Medications: Current Outpatient Medications on File Prior to Visit  Medication Sig Dispense Refill   escitalopram  (LEXAPRO ) 10 MG tablet Take 10 mg by mouth daily.     escitalopram  (LEXAPRO ) 10 MG tablet Take 1 tablet (10 mg total) by mouth daily. 90 tablet 3   ibuprofen  (ADVIL ) 200 MG tablet Take 200 mg by mouth as needed for moderate pain (pain score 4-6) or headache.     levETIRAcetam  (KEPPRA ) 250 MG tablet Take 1 tablet (250 mg total) by mouth 2 (two) times daily. 60 tablet 5   OVER THE COUNTER MEDICATION Take 15 mg by mouth 2 (two) times daily as needed (anxiety). THC  gummy 15 mg     No current facility-administered medications on file prior to visit.    Allergies: No Known Allergies Past Medical History:  Past Medical History:  Diagnosis Date   ADHD    Anxiety    Depression    Past Surgical History:  Past Surgical History:  Procedure Laterality Date   APPLICATION OF CRANIAL NAVIGATION N/A 04/18/2023   Procedure: APPLICATION OF CRANIAL NAVIGATION;  Surgeon: Onetha Kuba, MD;  Location: [facility] OR;  Service: Neurosurgery;  Laterality: N/A;   CRANIOTOMY Right 04/18/2023   Procedure: OCCIPITAL PARIETAL CRANI FOR RESECTION OF PARASAGITTAL CONVEXITY MENINGIOMA;  Surgeon: Onetha Kuba, MD;  Location: [facility] OR;  Service: Neurosurgery;  Laterality: Right;   IR ANGIO EXTERNAL CAROTID SEL EXT CAROTID UNI R MOD SED  04/17/2023   IR ANGIO INTRA EXTRACRAN SEL INTERNAL CAROTID UNI R MOD SED  04/17/2023   IR ANGIOGRAM FOLLOW UP STUDY  04/17/2023   IR NEURO EACH ADD'L AFTER BASIC UNI RIGHT (MS)  04/17/2023   IR TRANSCATH/EMBOLIZ  04/17/2023   RADIOLOGY WITH ANESTHESIA N/A 04/17/2023   Procedure: Right middle meningeal artery embolization;  Surgeon: Lanis Pupa, MD;  Location: [facility] OR;  Service: Radiology;  Laterality: N/A;   SKIN BIOPSY Right    Right knee- in derm office   Social History:  Social History   Socioeconomic History   Marital status: Legally Separated    Spouse name: Not on file   Number of children: Not on file   Years of education: Not on file   Highest education  level: Not on file  Occupational History   Not on file  Tobacco Use   Smoking status: Never   Smokeless tobacco: Never  Vaping Use   Vaping status: Some Days   Substances: THC  Substance and Sexual Activity   Alcohol use: Yes    Alcohol/week: 7.0 standard drinks of alcohol    Types: 7 Cans of beer per week    Comment: 1-2 beer or wine per day   Drug use: Yes    Types: Marijuana   Sexual activity: Not on file  Other Topics Concern   Not on file  Social History Narrative   Right  [id number]   Social Drivers of Health   Tobacco Use: Low Risk (03/24/2024)   Patient History    Smoking Tobacco Use: Never    Smokeless Tobacco Use: Never    Passive Exposure: Not on file  Financial Resource Strain: Not on file  Food Insecurity: No Food Insecurity (09/24/2023)   Hunger Vital Sign    Worried About Running Out of Food in the Last Year: Never true    Ran Out of Food in the Last Year: Never true  Transportation Needs: No Transportation Needs (09/24/2023)   PRAPARE - Administrator, Civil Service (Medical): No    Lack of Transportation (Non-Medical): No  Physical Activity: Not on file  Stress: Not on file  Social Connections: Not on file  Intimate Partner Violence: Not At Risk (09/24/2023)   Humiliation, Afraid, Rape, and Kick questionnaire    Fear of Current or Ex-Partner: No    Emotionally Abused: No    Physically Abused: No    Sexually Abused: No  Depression (PHQ2-9): Low Risk (09/24/2023)   Depression (PHQ2-9)    PHQ-2 Score: 1  Alcohol Screen: Not on file  Housing: Unknown (09/24/2023)   Housing Stability Vital Sign    Unable to Pay for Housing in the Last Year: No    Number of Times Moved in the Last Year: Not on file    Homeless in the Last Year: No  Utilities: Not At Risk (09/24/2023)   [employer] Utilities    Threatened with loss of utilities: No  Health Literacy: Not on file   Family History:  Family History  Problem Relation Age of Onset   Stroke Paternal Grandfather    Hemophilia Maternal Great-grandmother     Review of Systems: Constitutional: Doesn't report fevers, chills or abnormal weight loss Eyes: Doesn't report blurriness of vision Ears, nose, mouth, throat, and face: Doesn't report sore throat Respiratory: Doesn't report cough, dyspnea or wheezes Cardiovascular: Doesn't report palpitation, chest discomfort  Gastrointestinal:  Doesn't report nausea, constipation, diarrhea GU: Doesn't report incontinence Skin: Doesn't report skin  rashes Neurological: Per HPI Musculoskeletal: Doesn't report joint pain Behavioral/Psych: Doesn't report anxiety  Physical Exam: Vitals:   08/12/24 1220  BP: 115/70  Pulse: 69  Resp: 20  Temp: 97.9 F (36.6 C)  SpO2: 96%   KPS: 90. General: Alert, cooperative, pleasant, in no acute distress Head: Normal EENT: No conjunctival injection or scleral icterus.  Lungs: Resp effort normal Cardiac: Regular rate Abdomen: Non-distended abdomen Skin: No rashes cyanosis or petechiae. Extremities: No clubbing or edema  Neurologic Exam: Mental Status: Awake, alert, attentive to examiner. Oriented to self and environment. Language is fluent with intact comprehension.  Cranial Nerves: Visual acuity is grossly normal. Visual fields are full. Extra-ocular movements intact. No ptosis. Face is symmetric Motor: Tone and bulk are normal. Power is full in both arms  and legs. Reflexes are symmetric, no pathologic reflexes present.  Sensory: Intact to light touch Gait: Normal.   Labs: I have reviewed the data as listed    Component Value Date/Time   NA 138 04/19/2023 0532   K 4.1 04/19/2023 0532   CL 104 04/19/2023 0532   CO2 26 04/19/2023 0532   GLUCOSE 129 (H) 04/19/2023 0532   BUN 11 04/19/2023 0532   CREATININE 0.83 04/19/2023 0532   CALCIUM 8.4 (L) 04/19/2023 0532   PROT 7.3 04/11/2023 1000   ALBUMIN  4.5 04/11/2023 1000   AST 20 04/11/2023 1000   ALT 26 04/11/2023 1000   ALKPHOS 38 04/11/2023 1000   BILITOT 0.7 04/11/2023 1000   GFRNONAA >60 04/19/2023 0532   GFRAA >60 02/18/2018 1204   Lab Results  Component Value Date   WBC 28.0 (H) 04/18/2023   NEUTROABS 8.3 (H) 04/11/2023   HGB 11.2 (L) 04/18/2023   HCT 32.3 (L) 04/18/2023   MCV 87.5 04/18/2023   PLT 210 04/18/2023    Imaging: [facility] Clinician Interpretation: I have personally reviewed the CNS images as listed.  My interpretation, in the context of the patient's clinical presentation, is stable disease  MR BRAIN W WO  CONTRAST Result Date: 07/14/2024 EXAM: MRI BRAIN WITH AND WITHOUT CONTRAST 07/14/2024 10:57:39 AM TECHNIQUE: Multiplanar multisequence MRI of the head/brain was performed with and without the administration of intravenous contrast. COMPARISON: None available. CLINICAL HISTORY: Brain/CNS neoplasm, assess treatment response. FINDINGS: BRAIN AND VENTRICLES: A plaque-like meningioma is again noted within the midline of the interhemispheric fissure posteriorly, which is involving and constricting the superior sagittal sinus posteriorly. The mass appears similar in size and extent on the axial, coronal, and sagittal contrast-enhanced images, measuring up to 18 mm in AP thickness on the sagittal sequence and up to 2.2 cm in transverse diameter on the coronal images. The lumen of the superior sagittal sinus is narrowed, but patent. There is an overlying craniotomy. There is mildly increased T2 signal present within the subcortical white matter of the right parietal lobe, as before. There is no adverse interval change. No acute infarct. No acute intracranial hemorrhage. No mass effect or midline shift. No hydrocephalus. The sella is unremarkable. Normal flow voids. ORBITS: No acute abnormality. SINUSES: No acute abnormality. BONES AND SOFT TISSUES: Normal bone marrow signal and enhancement. There is an overlying craniotomy. No acute soft tissue abnormality. IMPRESSION: 1. Stable plaque-like meningioma within the midline of the interhemispheric fissure posteriorly, involving and constricting the superior sagittal sinus posteriorly. The mass measures up to 18 mm in AP thickness and 2.2 cm in transverse diameter. The lumen of the superior sagittal sinus is narrowed but patent. 2. Mildly increased T2 signal within the subcortical white matter of the right parietal lobe, as before. No adverse interval change. Electronically signed by: Evalene Coho MD 07/14/2024 11:58 AM EST RP Workstation: [id number]      Assessment/Plan Meningioma (HCC)  Ronny Korff is clinically stable today.  No new or progressive changes.  MRI brain demonstrates stability of residual mass, with additional stability of post-contrast signal extending along the dural margins.  This is encouraging and supportive of post-surgical etiology.  Recommended continuing imaging surveillance.  Will con't to follow with Dr. Onetha, as well as Dr. Skeet.  Will con't Keppra  250mg  BID.    We ask that Tadashi Burkel return to clinic in 12 months following next brain MRI, or sooner as needed.  All questions were answered. The patient knows to call the clinic with any  problems, questions or concerns. No barriers to learning were detected.  The total time spent in the encounter was 30 minutes and more than 50% was on counseling and review of test results   Arthea MARLA Manns, MD Medical Director of Neuro-Oncology [facility] 08/12/2024 12:47 PM "

## 2024-08-19 ENCOUNTER — Other Ambulatory Visit: Payer: Self-pay

## 2024-08-19 MED ORDER — DOXYCYCLINE HYCLATE 100 MG PO TABS
100.0000 mg | ORAL_TABLET | Freq: Two times a day (BID) | ORAL | 0 refills | Status: AC
  Filled 2024-08-19: qty 20, 10d supply, fill #0

## 2024-08-19 MED ORDER — CIPROFLOXACIN-DEXAMETHASONE 0.3-0.1 % OT SUSP
4.0000 [drp] | Freq: Two times a day (BID) | OTIC | 1 refills | Status: AC
  Filled 2024-08-19: qty 7.5, 19d supply, fill #0

## 2024-08-22 ENCOUNTER — Other Ambulatory Visit: Payer: Self-pay

## 2024-09-24 ENCOUNTER — Ambulatory Visit: Admitting: Neurology

## 2025-07-20 ENCOUNTER — Inpatient Hospital Stay: Admitting: Internal Medicine
